# Patient Record
Sex: Male | Born: 1945
Health system: Southern US, Community
[De-identification: ages and names within clinical notes are randomized; demographics above are authoritative.]

## PROBLEM LIST (undated history)

## (undated) DIAGNOSIS — T7840XA Allergy, unspecified, initial encounter: Secondary | ICD-10-CM

## (undated) DIAGNOSIS — R001 Bradycardia, unspecified: Secondary | ICD-10-CM

## (undated) DIAGNOSIS — K4090 Unilateral inguinal hernia, without obstruction or gangrene, not specified as recurrent: Secondary | ICD-10-CM

## (undated) DIAGNOSIS — I7121 Aneurysm of the ascending aorta, without rupture: Secondary | ICD-10-CM

## (undated) DIAGNOSIS — I2510 Atherosclerotic heart disease of native coronary artery without angina pectoris: Secondary | ICD-10-CM

## (undated) HISTORY — PX: CLOSED REDUCTION ELBOW DISLOCATION: SUR215

## (undated) HISTORY — DX: Allergy, unspecified, initial encounter: T78.40XA

## (undated) HISTORY — DX: Aneurysm of the ascending aorta, without rupture: I71.21

## (undated) HISTORY — DX: Atherosclerotic heart disease of native coronary artery without angina pectoris: I25.10

## (undated) HISTORY — PX: ADENOIDECTOMY: SUR15

---
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
## 2000-07-24 HISTORY — PX: KNEE ARTHROSCOPY: SHX127

## 2008-04-08 ENCOUNTER — Encounter: Payer: Self-pay | Admitting: Family Medicine

## 2009-04-12 DIAGNOSIS — E785 Hyperlipidemia, unspecified: Secondary | ICD-10-CM | POA: Insufficient documentation

## 2009-11-20 ENCOUNTER — Emergency Department (HOSPITAL_COMMUNITY)
Admission: EM | Admit: 2009-11-20 | Discharge: 2009-11-20 | Payer: Self-pay | Source: Home / Self Care | Admitting: Emergency Medicine

## 2010-10-11 LAB — DIFFERENTIAL
Basophils Absolute: 0 10*3/uL (ref 0.0–0.1)
Eosinophils Absolute: 0 10*3/uL (ref 0.0–0.7)
Eosinophils Relative: 0 % (ref 0–5)
Lymphocytes Relative: 16 % (ref 12–46)
Monocytes Absolute: 0.5 10*3/uL (ref 0.1–1.0)
Neutro Abs: 3.9 10*3/uL (ref 1.7–7.7)

## 2010-10-11 LAB — TYPE AND SCREEN

## 2010-10-11 LAB — BASIC METABOLIC PANEL
BUN: 17 mg/dL (ref 6–23)
CO2: 27 mEq/L (ref 19–32)
Creatinine, Ser: 1.13 mg/dL (ref 0.4–1.5)
Sodium: 141 mEq/L (ref 135–145)

## 2010-10-11 LAB — CBC
HCT: 42.7 % (ref 39.0–52.0)
Hemoglobin: 14.4 g/dL (ref 13.0–17.0)
MCV: 95.7 fL (ref 78.0–100.0)
RBC: 4.46 MIL/uL (ref 4.22–5.81)

## 2010-10-18 ENCOUNTER — Encounter: Payer: Self-pay | Admitting: Family Medicine

## 2010-10-18 ENCOUNTER — Ambulatory Visit (INDEPENDENT_AMBULATORY_CARE_PROVIDER_SITE_OTHER): Payer: Medicare Other | Admitting: Family Medicine

## 2010-10-18 VITALS — BP 120/80 | HR 64 | Temp 97.9°F | Resp 12 | Ht 70.0 in | Wt 173.0 lb

## 2010-10-18 DIAGNOSIS — E785 Hyperlipidemia, unspecified: Secondary | ICD-10-CM

## 2010-10-18 DIAGNOSIS — N400 Enlarged prostate without lower urinary tract symptoms: Secondary | ICD-10-CM

## 2010-10-18 DIAGNOSIS — R350 Frequency of micturition: Secondary | ICD-10-CM

## 2010-10-18 DIAGNOSIS — R5383 Other fatigue: Secondary | ICD-10-CM

## 2010-10-18 DIAGNOSIS — Z0000 Encounter for general adult medical examination without abnormal findings: Secondary | ICD-10-CM

## 2010-10-18 LAB — CBC WITH DIFFERENTIAL/PLATELET
Eosinophils Absolute: 0 10*3/uL (ref 0.0–0.7)
Eosinophils Relative: 1.1 % (ref 0.0–5.0)
HCT: 43.5 % (ref 39.0–52.0)
Lymphocytes Relative: 40.1 % (ref 12.0–46.0)
Lymphs Abs: 1.5 10*3/uL (ref 0.7–4.0)
MCHC: 34.9 g/dL (ref 30.0–36.0)
Neutrophils Relative %: 46.3 % (ref 43.0–77.0)
RDW: 13.2 % (ref 11.5–14.6)
WBC: 3.6 10*3/uL — ABNORMAL LOW (ref 4.5–10.5)

## 2010-10-18 LAB — BASIC METABOLIC PANEL
BUN: 20 mg/dL (ref 6–23)
Chloride: 107 mEq/L (ref 96–112)
Creatinine, Ser: 0.9 mg/dL (ref 0.4–1.5)
GFR: 90.6 mL/min (ref >60.00)
Potassium: 5 mEq/L (ref 3.5–5.1)

## 2010-10-18 LAB — LIPID PANEL: HDL: 59.3 mg/dL (ref >39.00)

## 2010-10-18 LAB — LDL CHOLESTEROL, DIRECT: Direct LDL: 184.2 mg/dL

## 2010-10-18 LAB — PSA: PSA: 1.23 ng/mL (ref 0.10–4.00)

## 2010-10-18 LAB — TSH: TSH: 1.39 u[IU]/mL (ref 0.35–5.50)

## 2010-10-18 LAB — TESTOSTERONE: Testosterone: 570.87 ng/dL (ref 350.00–890.00)

## 2010-10-18 NOTE — Progress Notes (Signed)
Subjective:    Patient ID: Hunter Collins, male    DOB: [birth year], 65 y.o.   MRN: [id number]  HPI  Patient to establish care. Patient is here for wellness visit. Past medical history reveals history of dyslipidemia. He has some nocturia and urine frequency at night.   Decrease in stamina and questions of concern for possible testosterone deficiency .  Still runs regularly but much less stamina.  Duration of symptoms approximately 2 years. He takes no medications. No known drug allergies.   History of dyslipidemia with low HDL and high cholesterol.  This has not been checked in couple of years.  Only prior surgery is tonsillectomy. Family history revealing for mother dying of cancer at age 19. Unknown type. Patient nonsmoker. Patient with occasional alcohol use. Runs for exercise. Tetanus 2009. Pneumovax 2011. No prior colon cancer screening.  1.  Risk factors based on Past Medical , Social, and Family history  Biological father hx unknown.  Mother cancer as above.  No signif chronic medical problems.  No history of smoking.  Takes no meds. 2.  Limitations in physical activities  None.  Runs daily and exercises with cross training several times per week. 3.  Depression/mood No depression or anxiety issues. 4.  Hearing  No deficits. 5.  ADLs  Fully independent in all. 6.  Cognitive function (orientation to time and place, language, writing, speech,memory)  No short or long term memory deficits.  No speech or language deficits.  Judgement is intact. 7.  Home Safety  No issues identified.   8.  Height, weight, and visual acuity.  Height and weight have been stable for quite some time. 9.  Counseling  Immunizations, diet, exercise, and appropriate screening tests for age addressed. 10. Recommendation of preventive services.  Zostavax discussed but not covered under his current insurance.  Needs screening colonoscopy 11. Labs based on risk factors  Lipid, PSA, CBC, BMP, testosterone level. 12. Care  Plan  As above.  Pt encouraged to continue with regular exercise.    Review of Systems  Constitutional: Positive for fatigue. Negative for fever, chills, activity change, appetite change and unexpected weight change.  HENT: Negative for ear pain, congestion, sore throat, trouble swallowing and neck pain.   Eyes: Negative for pain and visual disturbance.  Respiratory: Negative for cough, chest tightness, shortness of breath and wheezing.   Cardiovascular: Negative for chest pain, palpitations and leg swelling.  Gastrointestinal: Negative for nausea, vomiting, abdominal pain, diarrhea, constipation, blood in stool, abdominal distention and rectal pain.  Genitourinary: Positive for frequency. Negative for dysuria, hematuria and testicular pain.  Musculoskeletal: Negative for joint swelling and arthralgias.  Skin: Negative for rash.  Neurological: Negative for dizziness, syncope and headaches.  Hematological: Negative for adenopathy.  Psychiatric/Behavioral: Negative for confusion and dysphoric mood.       Objective:   Physical Exam  Constitutional: He is oriented to person, place, and time. He appears well-developed and well-nourished. No distress.  HENT:  Head: Normocephalic and atraumatic.  Right Ear: External ear normal.  Left Ear: External ear normal.  Mouth/Throat: Oropharynx is clear and moist.  Eyes: Conjunctivae and EOM are normal. Pupils are equal, round, and reactive to light.  Neck: Normal range of motion. Neck supple. No thyromegaly present.  Cardiovascular: Normal rate, regular rhythm and normal heart sounds.   No murmur heard. Pulmonary/Chest: No respiratory distress. He has no wheezes. He has no rales.  Abdominal: Soft. Bowel sounds are normal. He exhibits no distension and no mass.  There is no tenderness. There is no rebound and no guarding.  Genitourinary: Rectum normal and prostate normal.  Musculoskeletal: He exhibits no edema.  Lymphadenopathy:    He has no  cervical adenopathy.  Neurological: He is alert and oriented to person, place, and time. He displays normal reflexes. No cranial nerve deficit.  Skin: No rash noted.  Psychiatric: He has a normal mood and affect.          Assessment & Plan:   #1 wellness issues discussed. Needs colon cancer screening. Discussed shingles vaccine but no coverage. Tetanus and Pneumovax up-to-date. Continue yearly flu vaccine.  #2 decreased stamina and fatigue. Check labs with CBC, TSH and testosterone level.  #3 reported dyslipidemia with history of low HDL. Reassess lipids

## 2010-10-19 ENCOUNTER — Telehealth: Payer: Self-pay | Admitting: *Deleted

## 2010-10-19 NOTE — Telephone Encounter (Signed)
Pt informed and he will work on his diet

## 2011-07-04 ENCOUNTER — Ambulatory Visit (INDEPENDENT_AMBULATORY_CARE_PROVIDER_SITE_OTHER): Payer: Medicare Other | Admitting: Family Medicine

## 2011-07-04 ENCOUNTER — Encounter: Payer: Self-pay | Admitting: Family Medicine

## 2011-07-04 VITALS — BP 120/70 | Temp 97.8°F | Wt 170.0 lb

## 2011-07-04 DIAGNOSIS — B9789 Other viral agents as the cause of diseases classified elsewhere: Secondary | ICD-10-CM

## 2011-07-04 DIAGNOSIS — Z23 Encounter for immunization: Secondary | ICD-10-CM

## 2011-07-04 DIAGNOSIS — B349 Viral infection, unspecified: Secondary | ICD-10-CM

## 2011-07-04 MED ORDER — AZITHROMYCIN 250 MG PO TABS: ORAL_TABLET | ORAL | Status: AC

## 2011-07-04 NOTE — Progress Notes (Signed)
  Subjective:    Patient ID: Hunter Collins, male    DOB: 1945/08/13, 65 y.o.   MRN: [id number]  HPI  Acute visit. Onset last Thursday of illness. Bodyaches, sore throat, nasal congestion and subsequent cough. He actually ran a half marathon on Saturday. Feels somewhat better today. Still has cough which is occasionally productive. Generally very healthy. No history of smoking. No flu vaccine yet. Sore throat symptoms have improved. Using NyQuil for nighttime cough relief  Review of Systems  Constitutional: Negative for fever, chills and activity change.  HENT: Positive for congestion and sore throat.   Respiratory: Positive for cough. Negative for wheezing.   Cardiovascular: Negative for chest pain.       Objective:   Physical Exam  Constitutional: He appears well-developed and well-nourished.  HENT:  Right Ear: External ear normal.  Left Ear: External ear normal.  Mouth/Throat: Oropharynx is clear and moist.  Neck: Neck supple.  Cardiovascular: Normal rate and regular rhythm.   Pulmonary/Chest: Effort normal and breath sounds normal. No respiratory distress. He has no wheezes. He has no rales.  Musculoskeletal: He exhibits no edema.  Lymphadenopathy:    He has no cervical adenopathy.          Assessment & Plan:  Probable viral syndrome. No antibiotics indicated this time. If he has any recurrent fever start Zithromax. Flu vaccine given

## 2011-07-04 NOTE — Patient Instructions (Signed)
Follow up promptly for any fever or worsening symptoms 

## 2011-09-20 ENCOUNTER — Telehealth: Payer: Self-pay | Admitting: *Deleted

## 2011-09-20 DIAGNOSIS — M25569 Pain in unspecified knee: Secondary | ICD-10-CM

## 2011-09-20 NOTE — Telephone Encounter (Signed)
Received a referral request via letter from pt requesting to see Dr Lavada Mesi (friend a associate in a bible class) for right knee pain

## 2011-09-21 NOTE — Telephone Encounter (Signed)
Will refer.  Let pt know

## 2011-09-21 NOTE — Telephone Encounter (Signed)
Pt informed on home VM, will scan letter to chart

## 2012-05-08 ENCOUNTER — Ambulatory Visit (INDEPENDENT_AMBULATORY_CARE_PROVIDER_SITE_OTHER): Payer: Medicare Other

## 2012-05-08 DIAGNOSIS — Z23 Encounter for immunization: Secondary | ICD-10-CM

## 2013-01-08 ENCOUNTER — Ambulatory Visit (INDEPENDENT_AMBULATORY_CARE_PROVIDER_SITE_OTHER): Payer: Medicare Other | Admitting: Family Medicine

## 2013-01-08 ENCOUNTER — Encounter: Payer: Self-pay | Admitting: Family Medicine

## 2013-01-08 ENCOUNTER — Telehealth: Payer: Self-pay | Admitting: Family Medicine

## 2013-01-08 VITALS — BP 110/72 | HR 97 | Temp 98.2°F | Wt 162.0 lb

## 2013-01-08 DIAGNOSIS — J019 Acute sinusitis, unspecified: Secondary | ICD-10-CM

## 2013-01-08 MED ORDER — AZITHROMYCIN 250 MG PO TABS: ORAL_TABLET | ORAL | Status: DC

## 2013-01-08 NOTE — Progress Notes (Signed)
  Subjective:    Patient ID: Hunter Collins, male    DOB: 27-Sep-1945, 67 y.o.   MRN: [id number]  HPI Here for 2 weeks of stuffy head, PND, chest tightness and coughing up green sputum. No fever. He tried Zyrtec D.    Review of Systems  Constitutional: Negative.   HENT: Positive for congestion, postnasal drip and sinus pressure.   Eyes: Negative.   Respiratory: Positive for cough and chest tightness.        Objective:   Physical Exam  Constitutional: He appears well-developed and well-nourished. No distress.  HENT:  Right Ear: External ear normal.  Left Ear: External ear normal.  Mouth/Throat: Oropharynx is clear and moist.  Eyes: Conjunctivae are normal.  Neck: No thyromegaly present.  Pulmonary/Chest: Effort normal and breath sounds normal. No respiratory distress. He has no wheezes. He has no rales.  Lymphadenopathy:    He has no cervical adenopathy.          Assessment & Plan:  Add Mucinex prn

## 2013-01-08 NOTE — Telephone Encounter (Signed)
Patient Information:  Caller Name: Jervis  Phone: [phone]  Patient: Hunter Collins, Hunter Collins  Gender: Male  DOB: 11/03/1945  Age: 67 Years  PCP: [facility])  Office Follow Up:  Does the office need to follow up with this patient?: N/A  Instructions For The Office: N/A   Symptoms  Reason For Call & Symptoms: Cold sx for 11 days. Now getting chest congestion. Requesting appt.  Reviewed Health History In EMR: Yes  Reviewed Medications In EMR: Yes  Reviewed Allergies In EMR: Yes  Reviewed Surgeries / Procedures: Yes  Date of Onset of Symptoms: 12/29/2012  Treatments Tried: Tylenol, Zyrtec  Treatments Tried Worked: No  Guideline(s) Used:  Colds  Disposition Per Guideline:   See Today or Tomorrow in Office  Reason For Disposition Reached:   Patient wants to be seen  Advice Given:  N/A  Patient Will Follow Care Advice:  YES  Appointment Scheduled:  01/08/2013 11:15:00 Appointment Scheduled Provider:  Gershon Crane (Family Practice)

## 2013-01-15 ENCOUNTER — Encounter: Payer: Self-pay | Admitting: Family Medicine

## 2013-01-15 ENCOUNTER — Ambulatory Visit (INDEPENDENT_AMBULATORY_CARE_PROVIDER_SITE_OTHER): Payer: Medicare Other | Admitting: Family Medicine

## 2013-01-15 VITALS — BP 110/68 | HR 54 | Temp 98.2°F | Resp 14 | Wt 165.2 lb

## 2013-01-15 DIAGNOSIS — R1031 Right lower quadrant pain: Secondary | ICD-10-CM

## 2013-01-15 NOTE — Progress Notes (Signed)
  Subjective:    Patient ID: Hunter Collins, male    DOB: [birth year], 67 y.o.   MRN: [id number]  HPI  Patient seen for mild right inguinal discomfort noted over the past couple of months Worse after recent cough. No injury. No history of hernia. He has not noted any definite bulge. No abdominal pain Still running regularly without difficulty He's not noted adenopathy. No skin rashes. No appetite or weight changes.  No past medical history on file. Past Surgical History  Procedure Laterality Date  . Tonsillectomy and adenoidectomy  1960    reports that he has never smoked. He has never used smokeless tobacco. He reports that  drinks alcohol. He reports that he does not use illicit drugs. family history is not on file. No Known Allergies   Review of Systems  Constitutional: Negative for appetite change and unexpected weight change.  Gastrointestinal: Negative for abdominal pain.  Genitourinary: Negative for dysuria.       Objective:   Physical Exam  Constitutional: He appears well-developed and well-nourished.  Cardiovascular: Normal rate and regular rhythm.   Pulmonary/Chest: Effort normal and breath sounds normal. No respiratory distress. He has no wheezes. He has no rales.  Genitourinary:  Testes are normal. No definite inguinal hernia. No significant inguinal adenopathy.          Assessment & Plan:  Right inguinal pain. No definite hernia. Question oblique muscle strain. Avoid abdominal crunches and vigorous abdominal exercises. Followup promptly for any bulge or swelling or recurrent pain

## 2013-06-11 ENCOUNTER — Ambulatory Visit (INDEPENDENT_AMBULATORY_CARE_PROVIDER_SITE_OTHER): Payer: Medicare Other

## 2013-06-11 DIAGNOSIS — Z23 Encounter for immunization: Secondary | ICD-10-CM

## 2013-06-16 ENCOUNTER — Telehealth: Payer: Self-pay | Admitting: Family Medicine

## 2013-06-16 NOTE — Telephone Encounter (Signed)
Informed patient that [facility]. And he does not need a referral to go to that location

## 2013-06-16 NOTE — Telephone Encounter (Signed)
Pt would like a referral to a podiatrist for a severe corn on his right foot.

## 2013-06-16 NOTE — Telephone Encounter (Signed)
Left message for patient to return call.

## 2014-04-02 ENCOUNTER — Other Ambulatory Visit: Payer: Self-pay | Admitting: Family Medicine

## 2014-04-02 ENCOUNTER — Telehealth: Payer: Self-pay | Admitting: Family Medicine

## 2014-04-02 DIAGNOSIS — L600 Ingrowing nail: Secondary | ICD-10-CM

## 2014-04-02 DIAGNOSIS — B070 Plantar wart: Secondary | ICD-10-CM

## 2014-04-02 NOTE — Telephone Encounter (Signed)
Referral is ordered

## 2014-04-02 NOTE — Telephone Encounter (Signed)
OK to refer.

## 2014-04-02 NOTE — Telephone Encounter (Signed)
Pt states he changed insurance and needs a new referral to [facility]  [phone]  R foot plantars wart and ingrown toenail on L foot, big toe. [employer]  Pt will bring copy of new insurance card

## 2014-04-07 ENCOUNTER — Telehealth: Payer: Self-pay | Admitting: Family Medicine

## 2014-04-07 NOTE — Telephone Encounter (Signed)
Called to scheduled pt with  [place], [place]  Address: [address] [place], [place] [address]  [phone] Their office will contact pt to schedule directly to schedule pt came by to check the status of referral I called [facility]  Was told that they can  Not scheduled patient until their office receive a paper copy fax  When i went to [facility]  A msg from  [facility] stated Per your request, a copy of this referral has been faxed to [phone]. Per judy tyra [facility]  INFORMED PT OF THIS HE IS AWARE

## 2014-04-30 ENCOUNTER — Ambulatory Visit (INDEPENDENT_AMBULATORY_CARE_PROVIDER_SITE_OTHER): Payer: Commercial Managed Care - HMO | Admitting: Family Medicine

## 2014-04-30 ENCOUNTER — Encounter: Payer: Self-pay | Admitting: Family Medicine

## 2014-04-30 VITALS — BP 120/70 | HR 50 | Temp 97.6°F | Ht 69.0 in | Wt 169.0 lb

## 2014-04-30 DIAGNOSIS — Z23 Encounter for immunization: Secondary | ICD-10-CM

## 2014-04-30 DIAGNOSIS — L989 Disorder of the skin and subcutaneous tissue, unspecified: Secondary | ICD-10-CM

## 2014-04-30 DIAGNOSIS — Z0000 Encounter for general adult medical examination without abnormal findings: Secondary | ICD-10-CM

## 2014-04-30 NOTE — Patient Instructions (Signed)
Let me know if you change your mind regarding colonoscopy screening or PSA

## 2014-04-30 NOTE — Progress Notes (Signed)
Subjective:    Patient ID: Hunter Collins, male    DOB: [birth year], 68 y.o.   MRN: [id number]  HPI Patient seen for wellness visit. Generally very healthy. He continues to run couple days per week and walks regularly. Takes no regular rx medications. He is requesting shingles vaccine and also needs Prevnar booster. He declines colonoscopy screening.  Skin lesion right upper lip which he noticed couple years ago. Slowly growing in size. Occasional irritation with shaving. No itching or bleeding.  No past medical history on file. Past Surgical History  Procedure Laterality Date  . Tonsillectomy and adenoidectomy  1960    reports that he has never smoked. He has never used smokeless tobacco. He reports that he drinks alcohol. He reports that he does not use illicit drugs. family history is not on file. No Known Allergies  1.  Risk factors based on Past Medical , Social, and Family history reviewed and as indicated above with no changes 2.  Limitations in physical activities None.  No recent falls. Very active with walking and running. 3.  Depression/mood No active depression or anxiety issues 4.  Hearing No defiits 5.  ADLs independent in all. 6.  Cognitive function (orientation to time and place, language, writing, speech,memory) no short or long term memory issues.  Language and judgement intact. 7.  Home Safety no issues 8.  Height, weight, and visual acuity.all stable. 9.  Counseling discussed continued weight bearing exercise. 10. Recommendation of preventive services. Flu vaccine, Prevnar 13 and shingles vaccine. 11. Labs based on risk factors pt declines 12. Care Plan as above. 13. Other Providers-none 14. Written schedule of screening/prevention services given to patient.    Review of Systems  Constitutional: Negative for fever, activity change, appetite change and fatigue.  HENT: Negative for congestion, ear pain and trouble swallowing.   Eyes: Negative for pain and  visual disturbance.  Respiratory: Negative for cough, shortness of breath and wheezing.   Cardiovascular: Negative for chest pain and palpitations.  Gastrointestinal: Negative for nausea, vomiting, abdominal pain, diarrhea, constipation, blood in stool, abdominal distention and rectal pain.  Genitourinary: Negative for dysuria, hematuria and testicular pain.  Musculoskeletal: Negative for arthralgias and joint swelling.  Skin: Negative for rash.  Neurological: Negative for dizziness, syncope and headaches.  Hematological: Negative for adenopathy.  Psychiatric/Behavioral: Negative for confusion and dysphoric mood.       Objective:   Physical Exam  Constitutional: He is oriented to person, place, and time. He appears well-developed and well-nourished. No distress.  HENT:  Head: Normocephalic and atraumatic.  Right Ear: External ear normal.  Left Ear: External ear normal.  Mouth/Throat: Oropharynx is clear and moist.  Eyes: Conjunctivae and EOM are normal. Pupils are equal, round, and reactive to light.  Neck: Normal range of motion. Neck supple. No thyromegaly present.  Cardiovascular: Normal rate, regular rhythm and normal heart sounds.   No murmur heard. Pulmonary/Chest: No respiratory distress. He has no wheezes. He has no rales.  Abdominal: Soft. Bowel sounds are normal. He exhibits no distension and no mass. There is no tenderness. There is no rebound and no guarding.  Musculoskeletal: He exhibits no edema.  Lymphadenopathy:    He has no cervical adenopathy.  Neurological: He is alert and oriented to person, place, and time. He displays normal reflexes. No cranial nerve deficit.  Skin: No rash noted.  Just above the right upper lip he has nodular lesion about 4 mm diameter with slight umbilication in the center and  superficial telangiectasias  Psychiatric: He has a normal mood and affect.          Assessment & Plan:  #1 health maintenance. Prevnar 13 given and shingles  vaccine after discussed risk and benefits. He declines colonoscopy screening. We discussed other screening such as PSA and he declines. #2 right face lesion. Suspect basal cell carcinoma. [facility]

## 2014-04-30 NOTE — Progress Notes (Signed)
Pre visit review using our clinic review tool, if applicable. No additional management support is needed unless otherwise documented below in the visit note. 

## 2014-07-30 DIAGNOSIS — L570 Actinic keratosis: Secondary | ICD-10-CM | POA: Diagnosis not present

## 2014-07-30 DIAGNOSIS — L821 Other seborrheic keratosis: Secondary | ICD-10-CM | POA: Diagnosis not present

## 2014-07-30 DIAGNOSIS — C44319 Basal cell carcinoma of skin of other parts of face: Secondary | ICD-10-CM | POA: Diagnosis not present

## 2014-07-30 DIAGNOSIS — D485 Neoplasm of uncertain behavior of skin: Secondary | ICD-10-CM | POA: Diagnosis not present

## 2014-08-28 DIAGNOSIS — R69 Illness, unspecified: Secondary | ICD-10-CM | POA: Diagnosis not present

## 2014-09-11 DIAGNOSIS — C44319 Basal cell carcinoma of skin of other parts of face: Secondary | ICD-10-CM | POA: Diagnosis not present

## 2014-09-18 ENCOUNTER — Telehealth: Payer: Self-pay | Admitting: Family Medicine

## 2014-09-18 NOTE — Telephone Encounter (Signed)
Noted  

## 2014-09-18 NOTE — Telephone Encounter (Signed)
Patient Name: Hunter Collins  DOB: May 04, 1946    Initial Comment Caller states c/o imbedded tick bite on his hip, removed it but hip is red   Nurse Assessment  Nurse: Verlin Fester RN, Stanton Kidney Date/Time (Eastern Time): 09/18/2014 11:52:20 AM  Confirm and document reason for call. If symptomatic, describe symptoms. ---Patient states he had a tick bite on his right hip and it is red.  Has the patient traveled out of the country within the last 30 days? ---No  Does the patient require triage? ---Yes  Related visit to physician within the last 2 weeks? ---No  Does the PT have any chronic conditions? (i.e. diabetes, asthma, etc.) ---No     Guidelines    Guideline Title Affirmed Question Affirmed Notes  Tick Bite Tick bite with no complications (all triage questions negative)    Final Disposition User   [place], RN, [place]

## 2014-11-06 ENCOUNTER — Encounter: Payer: Self-pay | Admitting: Family Medicine

## 2014-11-06 ENCOUNTER — Ambulatory Visit (INDEPENDENT_AMBULATORY_CARE_PROVIDER_SITE_OTHER): Payer: Commercial Managed Care - HMO | Admitting: Family Medicine

## 2014-11-06 VITALS — BP 100/72 | HR 61 | Temp 98.2°F | Ht 69.0 in | Wt 170.4 lb

## 2014-11-06 DIAGNOSIS — J069 Acute upper respiratory infection, unspecified: Secondary | ICD-10-CM | POA: Diagnosis not present

## 2014-11-06 MED ORDER — AZITHROMYCIN 250 MG PO TABS: ORAL_TABLET | ORAL | Status: DC

## 2014-11-06 NOTE — Patient Instructions (Addendum)
INSTRUCTIONS FOR UPPER RESPIRATORY INFECTION:  -plenty of rest and fluids  -nasal saline wash 2-3 times daily (use prepackaged nasal saline or bottled/distilled water if making your own)   -can use sinex or afrin nasal spray for drainage and nasal congestion - but do NOT use longer then 3-4 days  -can use tylenol or ibuprofen as directed for aches and sorethroat  -in the winter time, using a humidifier at night is helpful (please follow cleaning instructions)  -if you are taking a cough medication - use only as directed, may also try a teaspoon of honey to coat the throat and throat lozenges  -for sore throat, salt water gargles can help  -follow up if you have fevers, facial pain, tooth pain, difficulty breathing or are worsening or not getting better as expected

## 2014-11-06 NOTE — Progress Notes (Signed)
HPI:  Hunter Collins is a 69 yo patient of Dr. Elease Hashimoto here for and acute visit for Resp Infection: -started: 4 days ago -symptoms:nasal congestion, sore throat, cough, body aches, subjective fever, mild diarrhea a few times -denies: fever, SOB, NVD, sinus pain, tooth pain -has tried: musinex, claritin -sick contacts/travel/risks: denies flu exposure, tick exposure or or Ebola risks - a few colleagues at work with the "crud" -reports had zpack for this in the past -still walking 5-6 miles per day even while sick  ROS: See pertinent positives and negatives per HPI.  No past medical history on file.  Past Surgical History  Procedure Laterality Date  . Tonsillectomy and adenoidectomy  1960    No family history on file.  History   Social History  . Marital Status: Married    Spouse Name: N/A  . Number of Children: N/A  . Years of Education: N/A   Social History Main Topics  . Smoking status: Never Smoker   . Smokeless tobacco: Never Used  . Alcohol Use: Yes     Comment: weekends  . Drug Use: No  . Sexual Activity: Not on file   Other Topics Concern  . None   Social History Narrative     Current outpatient prescriptions:  .  Cholecalciferol (VITAMIN D3) 5000 UNITS CAPS, Take by mouth., Disp: , Rfl:  .  ibuprofen (ADVIL,MOTRIN) 100 MG chewable tablet, Chew 100 mg by mouth every 8 (eight) hours as needed.  , Disp: , Rfl:  .  Misc Natural Products (GLUCOSAMINE CHONDROITIN ADV) TABS, Take by mouth daily as needed.  , Disp: , Rfl:  .  Multiple Vitamins-Minerals (DAILY-VITE MENS FORMULA PO), Take by mouth daily.  , Disp: , Rfl:  .  azithromycin (ZITHROMAX) 250 MG tablet, 2 tabs on the first day and then 1 tab daily for 4 days, Disp: 6 tablet, Rfl: 0  EXAM:  Filed Vitals:   11/06/14 1329  BP: 100/72  Pulse: 61  Temp: 98.2 F (36.8 C)    Body mass index is 25.15 kg/(m^2).  GENERAL: vitals reviewed and listed above, alert, oriented, appears well hydrated and in  no acute distress  HEENT: atraumatic, conjunttiva clear, no obvious abnormalities on inspection of external nose and ears, normal appearance of ear canals and TMs, clear nasal congestion, mild post oropharyngeal erythema with PND, no tonsillar edema or exudate, no sinus TTP  NECK: no obvious masses on inspection  LUNGS: clear to auscultation bilaterally, no wheezes, rales or rhonchi, good air movement  CV: HRRR, no peripheral edema  MS: moves all extremities without noticeable abnormality  PSYCH: pleasant and cooperative, no obvious depression or anxiety  ASSESSMENT AND PLAN:  Discussed the following assessment and plan:  Acute upper respiratory infection - Plan: azithromycin (ZITHROMAX) 250 MG tablet  -given HPI and exam findings today, a serious infection or illness is unlikely. We discussed potential etiologies, with VURI being most likely, and advised supportive care and monitoring. We discussed treatment side effects, likely course, antibiotic misuse, transmission, and signs of developing a serious illness. -he is worried for cap given hx of this, lung exam normal today, delayed abx provided if worsening, fevers, SOB or if not getting better as expected - warned of adverse reactions -of course, we advised to return or notify a doctor immediately if symptoms worsen or persist or new concerns arise.    Patient Instructions  INSTRUCTIONS FOR UPPER RESPIRATORY INFECTION:  -plenty of rest and fluids  -nasal saline wash 2-3  times daily (use prepackaged nasal saline or bottled/distilled water if making your own)   -clean nose with nasal saline before using the nasal steroid or sinex  -can use sinex nasal spray for drainage and nasal congestion - but do NOT use longer then 3-4 days  -can use tylenol or ibuprofen as directed for aches and sorethroat  -in the winter time, using a humidifier at night is helpful (please follow cleaning instructions)  -if you are taking a cough  medication - use only as directed, may also try a teaspoon of honey to coat the throat and throat lozenges  -for sore throat, salt water gargles can help  -follow up if you have fevers, facial pain, tooth pain, difficulty breathing or are worsening or not getting better as expected       Gilberto Streck, Jarrett Soho R.

## 2014-11-06 NOTE — Progress Notes (Signed)
Pre visit review using our clinic review tool, if applicable. No additional management support is needed unless otherwise documented below in the visit note. 

## 2014-12-23 ENCOUNTER — Telehealth: Payer: Self-pay | Admitting: Family Medicine

## 2014-12-23 NOTE — Telephone Encounter (Signed)
Pt called to ask for a referral to [facility]  Reason ;  Corn on right little toe and a callus on bottom of right foot

## 2014-12-23 NOTE — Telephone Encounter (Signed)
OK to refer.

## 2014-12-24 ENCOUNTER — Other Ambulatory Visit: Payer: Self-pay | Admitting: Family Medicine

## 2014-12-24 ENCOUNTER — Encounter: Payer: Self-pay | Admitting: Family Medicine

## 2014-12-24 DIAGNOSIS — L84 Corns and callosities: Secondary | ICD-10-CM

## 2014-12-24 NOTE — Telephone Encounter (Signed)
Referral is ordered

## 2015-01-28 DIAGNOSIS — D485 Neoplasm of uncertain behavior of skin: Secondary | ICD-10-CM | POA: Diagnosis not present

## 2015-01-28 DIAGNOSIS — D1801 Hemangioma of skin and subcutaneous tissue: Secondary | ICD-10-CM | POA: Diagnosis not present

## 2015-01-28 DIAGNOSIS — L821 Other seborrheic keratosis: Secondary | ICD-10-CM | POA: Diagnosis not present

## 2015-01-28 DIAGNOSIS — Z85828 Personal history of other malignant neoplasm of skin: Secondary | ICD-10-CM | POA: Diagnosis not present

## 2015-01-28 DIAGNOSIS — L905 Scar conditions and fibrosis of skin: Secondary | ICD-10-CM | POA: Diagnosis not present

## 2015-01-28 DIAGNOSIS — L570 Actinic keratosis: Secondary | ICD-10-CM | POA: Diagnosis not present

## 2015-01-28 DIAGNOSIS — D225 Melanocytic nevi of trunk: Secondary | ICD-10-CM | POA: Diagnosis not present

## 2015-01-29 ENCOUNTER — Ambulatory Visit (INDEPENDENT_AMBULATORY_CARE_PROVIDER_SITE_OTHER): Payer: Commercial Managed Care - HMO | Admitting: Podiatry

## 2015-01-29 DIAGNOSIS — M2041 Other hammer toe(s) (acquired), right foot: Secondary | ICD-10-CM

## 2015-01-29 DIAGNOSIS — Q828 Other specified congenital malformations of skin: Secondary | ICD-10-CM

## 2015-01-29 NOTE — Progress Notes (Signed)
Subjective:     Patient ID: Hunter Collins, male   DOB: 1946/05/10, 69 y.o.   MRN: [id number]  HPI This patient presents with painful callus under right foot and corn fifth toe right foot.  Patient is an avid runner who was born with clubfoot which has led to severe rearfoot changes in the absence of pain.  He has callus under ball of right foot.  He has corn on fifth toe right foot due to fifth toe rising up.  He presents for evaluation and treatment.  Review of Systems     Objective:   Physical Exam Objective: Review of past medical history, medications, social history and allergies were performed.  Vascular: Dorsalis pedis and posterior tibial pulses were palpable B/L, capillary refill was  WNL B/L, temperature gradient was WNL B/L   Skin:  No signs of symptoms of infection or ulcers on both feet.  Porokeratosis 2,3 right foot.  Nails: appear healthy with no signs of mycosis or infections  Sensory: Semmes Weinstein monifilament WNL   Orthopedic: Orthopedic evaluation demonstrates all joints distal t ankle have full ROM without crepitus, muscle power WNL B/L.  Plantarflexed 2,3 right foot.  Hammer toes 2-4 and 5 is contracted with helma durum              Assessment:    Porokeratosis right foot    Hammer toe right foot    Plan:     IE.  Debridement of porokeratosis.  Debrided heloma molle.  RTC prn

## 2015-01-29 NOTE — Progress Notes (Signed)
Patient ID: Hunter Collins, male   DOB: 13-Jan-1946, 69 y.o.   MRN: [id number]

## 2015-06-30 ENCOUNTER — Ambulatory Visit (INDEPENDENT_AMBULATORY_CARE_PROVIDER_SITE_OTHER): Payer: Commercial Managed Care - HMO | Admitting: Family Medicine

## 2015-06-30 DIAGNOSIS — Z23 Encounter for immunization: Secondary | ICD-10-CM | POA: Diagnosis not present

## 2015-07-06 ENCOUNTER — Ambulatory Visit (INDEPENDENT_AMBULATORY_CARE_PROVIDER_SITE_OTHER): Payer: Commercial Managed Care - HMO | Admitting: Family Medicine

## 2015-07-06 ENCOUNTER — Encounter: Payer: Self-pay | Admitting: Family Medicine

## 2015-07-06 ENCOUNTER — Other Ambulatory Visit: Payer: Commercial Managed Care - HMO

## 2015-07-06 VITALS — BP 118/82 | HR 51 | Temp 97.7°F | Resp 14 | Ht 69.0 in | Wt 168.9 lb

## 2015-07-06 DIAGNOSIS — Z0000 Encounter for general adult medical examination without abnormal findings: Secondary | ICD-10-CM

## 2015-07-06 DIAGNOSIS — L84 Corns and callosities: Secondary | ICD-10-CM | POA: Diagnosis not present

## 2015-07-06 DIAGNOSIS — K4090 Unilateral inguinal hernia, without obstruction or gangrene, not specified as recurrent: Secondary | ICD-10-CM

## 2015-07-06 LAB — LIPID PANEL
Cholesterol: 253 mg/dL — ABNORMAL HIGH (ref 0–200)
HDL: 54.1 mg/dL (ref >39.00)
LDL Cholesterol: 179 mg/dL — ABNORMAL HIGH (ref 0–99)
NonHDL: 198.51
TRIGLYCERIDES: 99 mg/dL (ref 0.0–149.0)
Total CHOL/HDL Ratio: 5
VLDL: 19.8 mg/dL (ref 0.0–40.0)

## 2015-07-06 LAB — CBC WITH DIFFERENTIAL/PLATELET
BASOS PCT: 0.7 % (ref 0.0–3.0)
Basophils Absolute: 0 10*3/uL (ref 0.0–0.1)
EOS PCT: 0.8 % (ref 0.0–5.0)
Eosinophils Absolute: 0 10*3/uL (ref 0.0–0.7)
HEMATOCRIT: 47.7 % (ref 39.0–52.0)
Hemoglobin: 15.9 g/dL (ref 13.0–17.0)
LYMPHS ABS: 2 10*3/uL (ref 0.7–4.0)
LYMPHS PCT: 41.5 % (ref 12.0–46.0)
MCHC: 33.3 g/dL (ref 30.0–36.0)
MCV: 96.9 fl (ref 78.0–100.0)
MONOS PCT: 11.3 % (ref 3.0–12.0)
Monocytes Absolute: 0.5 10*3/uL (ref 0.1–1.0)
NEUTROS ABS: 2.2 10*3/uL (ref 1.4–7.7)
NEUTROS PCT: 45.7 % (ref 43.0–77.0)
PLATELETS: 207 10*3/uL (ref 150.0–400.0)
RBC: 4.92 Mil/uL (ref 4.22–5.81)
RDW: 13.7 % (ref 11.5–15.5)
WBC: 4.8 10*3/uL (ref 4.0–10.5)

## 2015-07-06 LAB — BASIC METABOLIC PANEL
BUN: 21 mg/dL (ref 6–23)
CALCIUM: 9.7 mg/dL (ref 8.4–10.5)
CO2: 29 mEq/L (ref 19–32)
Chloride: 103 mEq/L (ref 96–112)
Creatinine, Ser: 1.12 mg/dL (ref 0.40–1.50)
GFR: 68.54 mL/min (ref >60.00)
Glucose, Bld: 95 mg/dL (ref 70–99)
Potassium: 4.5 mEq/L (ref 3.5–5.1)
SODIUM: 139 meq/L (ref 135–145)

## 2015-07-06 LAB — TSH: TSH: 1.92 u[IU]/mL (ref 0.35–4.50)

## 2015-07-06 LAB — HEPATIC FUNCTION PANEL
ALBUMIN: 4.3 g/dL (ref 3.5–5.2)
ALK PHOS: 55 U/L (ref 39–117)
ALT: 18 U/L (ref 0–53)
AST: 18 U/L (ref 0–37)
Bilirubin, Direct: 0.2 mg/dL (ref 0.0–0.3)
TOTAL PROTEIN: 6.6 g/dL (ref 6.0–8.3)
Total Bilirubin: 0.9 mg/dL (ref 0.2–1.2)

## 2015-07-06 NOTE — Patient Instructions (Signed)
Hernia, Adult A hernia is the bulging of an organ or tissue through a weak spot in the muscles of the abdomen (abdominal wall). Hernias develop most often near the navel or groin. There are many kinds of hernias. Common kinds include:  Femoral hernia. This kind of hernia develops under the groin in the upper thigh area.  Inguinal hernia. This kind of hernia develops in the groin or scrotum.  Umbilical hernia. This kind of hernia develops near the navel.  Hiatal hernia. This kind of hernia causes part of the stomach to be pushed up into the chest.  Incisional hernia. This kind of hernia bulges through a scar from an abdominal surgery. CAUSES This condition may be caused by:  Heavy lifting.  Coughing over a long period of time.  Straining to have a bowel movement.  An incision made during an abdominal surgery.  A birth defect (congenital defect).  Excess weight or obesity.  Smoking.  Poor nutrition.  Cystic fibrosis.  Excess fluid in the abdomen.  Undescended testicles. SYMPTOMS Symptoms of a hernia include:  A lump on the abdomen. This is the first sign of a hernia. The lump may become more obvious with standing, straining, or coughing. It may get bigger over time if it is not treated or if the condition causing it is not treated.  Pain. A hernia is usually painless, but it may become painful over time if treatment is delayed. The pain is usually dull and may get worse with standing or lifting heavy objects. Sometimes a hernia gets tightly squeezed in the weak spot (strangulated) or stuck there (incarcerated) and causes additional symptoms. These symptoms may include:  Vomiting.  Nausea.  Constipation.  Irritability. DIAGNOSIS A hernia may be diagnosed with:  A physical exam. During the exam your health care provider may ask you to cough or to make a specific movement, because a hernia is usually more visible when you move.  Imaging tests. These can  include:  X-rays.  Ultrasound.  CT scan. TREATMENT A hernia that is small and painless may not need to be treated. A hernia that is large or painful may be treated with surgery. Inguinal hernias may be treated with surgery to prevent incarceration or strangulation. Strangulated hernias are always treated with surgery, because lack of blood to the trapped organ or tissue can cause it to die. Surgery to treat a hernia involves pushing the bulge back into place and repairing the weak part of the abdomen. HOME CARE INSTRUCTIONS  Avoid straining.  Do not lift anything heavier than 10 lb (4.5 kg).  Lift with your leg muscles, not your back muscles. This helps avoid strain.  When coughing, try to cough gently.  Prevent constipation. Constipation leads to straining with bowel movements, which can make a hernia worse or cause a hernia repair to break down. You can prevent constipation by:  Eating a high-fiber diet that includes plenty of fruits and vegetables.  Drinking enough fluids to keep your urine clear or pale yellow. Aim to drink 6-8 glasses of water per day.  Using a stool softener as directed by your health care provider.  Lose weight, if you are overweight.  Do not use any tobacco products, including cigarettes, chewing tobacco, or electronic cigarettes. If you need help quitting, ask your health care provider.  Keep all follow-up visits as directed by your health care provider. This is important. Your health care provider may need to monitor your condition. SEEK MEDICAL CARE IF:  You have   swelling, redness, and pain in the affected area.  Your bowel habits change. SEEK IMMEDIATE MEDICAL CARE IF:  You have a fever.  You have abdominal pain that is getting worse.  You feel nauseous or you vomit.  You cannot push the hernia back in place by gently pressing on it while you are lying down.  The hernia:  Changes in shape or size.  Is stuck outside the  abdomen.  Becomes discolored.  Feels hard or tender.   This information is not intended to replace advice given to you by your health care provider. Make sure you discuss any questions you have with your health care provider.   Document Released: 07/10/2005 Document Revised: 07/31/2014 Document Reviewed: 05/20/2014 Elsevier Interactive Patient Education 2016 [employer]  

## 2015-07-06 NOTE — Progress Notes (Signed)
   Subjective:    Patient ID: Hunter Collins, male    DOB: [birth year], 69 y.o.   MRN: [id number]  HPI Here for several items  Plans to get complete physical next week. Requesting labs today. Declines PSA  Right inguinal hernia. Present for several years. Started become more painful with walking. Interestingly, does not have much pain with running Sometimes bulges with straining. Requesting surgical referral.  No prior hx of hernia.  Painful callus bottom of right foot. Has seen podiatrist previously. Had some sort of acid topical treatment and this improved. Runs several days per week.  No past medical history on file. Past Surgical History  Procedure Laterality Date  . Tonsillectomy and adenoidectomy  1960    reports that he has never smoked. He has never used smokeless tobacco. He reports that he drinks alcohol. He reports that he does not use illicit drugs. family history is not on file. No Known Allergies    Review of Systems  Constitutional: Negative for fatigue.  Eyes: Negative for visual disturbance.  Respiratory: Negative for cough, chest tightness and shortness of breath.   Cardiovascular: Negative for chest pain, palpitations and leg swelling.  Neurological: Negative for dizziness, syncope, weakness, light-headedness and headaches.       Objective:   Physical Exam  Constitutional: He appears well-developed and well-nourished.  Cardiovascular: Normal rate and regular rhythm.   Pulmonary/Chest: Effort normal and breath sounds normal. No respiratory distress. He has no wheezes. He has no rales.  Abdominal:  Small right inguinal hernia. Nontender  Skin:  Callused area ball of right foot. No evidence for plantar wart          Assessment & Plan:  #1 right inguinal hernia. Becoming more symptomatic. Set up surgical referral #2 callus right foot. We'll plan to consider trimming this at his upcoming physical.

## 2015-07-14 ENCOUNTER — Encounter: Payer: Self-pay | Admitting: Family Medicine

## 2015-07-14 ENCOUNTER — Ambulatory Visit (INDEPENDENT_AMBULATORY_CARE_PROVIDER_SITE_OTHER): Payer: Commercial Managed Care - HMO | Admitting: Family Medicine

## 2015-07-14 VITALS — BP 116/70 | HR 55 | Temp 97.8°F | Resp 16 | Ht 69.0 in | Wt 168.0 lb

## 2015-07-14 DIAGNOSIS — Z0000 Encounter for general adult medical examination without abnormal findings: Secondary | ICD-10-CM

## 2015-07-14 NOTE — Patient Instructions (Signed)
Corns and Calluses Corns are small areas of thickened skin that occur on the top, sides, or tip of a toe. They contain a cone-shaped core with a point that can press on a nerve below. This causes pain. Calluses are areas of thickened skin that can occur anywhere on the body including hands, fingers, palms, soles of the feet, and heels.Calluses are usually larger than corns.  CAUSES  Corns and calluses are caused by rubbing (friction) or pressure, such as from shoes that are too tight or do not fit properly.  RISK FACTORS Corns are more likely to develop in people who have toe deformities, such as hammer toes. Since calluses can occur with friction to any area of the skin, calluses are more likely to develop in people who:   Work with their hands.  Wear shoes that fit poorly, shoes that are too tight, or shoes that are high-heeled.  Have toes deformities. SYMPTOMS Symptoms of a corn or callus include:  A hard growth on the skin.   Pain or tenderness under the skin.   Redness and swelling.   Increased discomfort while wearing tight-fitting shoes. DIAGNOSIS  Corns and calluses may be diagnosed with a medical history and physical exam.  TREATMENT  Corns and calluses may be treated with:  Removing the cause of the friction or pressure. This may include:  Changing your shoes.  Wearing shoe inserts (orthotics) or other protective layers in your shoes, such as a corn pad.  Wearing gloves.  Medicines to help soften skin in the hardened, thickened areas.  Reducing the size of the corn or callus by removing the dead layers of skin.  Antibiotic medicines to treat infection.  Surgery, if a toe deformity is the cause. HOME CARE INSTRUCTIONS   Take medicines only as directed by your health care provider.  If you were prescribed an antibiotic, finish all of it even if you start to feel better.  Wear shoes that fit well. Avoid wearing high-heeled shoes and shoes that are too tight  or too loose.  Wear any padding, protective layers, gloves, or orthotics as directed by your health care provider.  Soak your hands or feet and then use a file or pumice stone to soften your corn or callus. Do this as directed by your health care provider.  Check your corn or callus every day for signs of infection. Watch for:  Redness, swelling, or pain.  Fluid, blood, or pus. SEEK MEDICAL CARE IF:   Your symptoms do not improve with treatment.  You have increased redness, swelling, or pain at the site of your corn or callus.  You have fluid, blood, or pus coming from your corn or callus.  You have new symptoms.   This information is not intended to replace advice given to you by your health care provider. Make sure you discuss any questions you have with your health care provider.   Document Released: 04/15/2004 Document Revised: 11/24/2014 Document Reviewed: 07/06/2014 Elsevier Interactive Patient Education 2016 Elsevier Inc.  

## 2015-07-14 NOTE — Progress Notes (Signed)
Pre visit review using our clinic review tool, if applicable. No additional management support is needed unless otherwise documented below in the visit note. 

## 2015-07-14 NOTE — Progress Notes (Signed)
   Subjective:    Patient ID: Hunter Collins, male    DOB: 01-14-1946, 69 y.o.   MRN: [id number]  HPI Patient seen for complete physical. Generally very healthy. Runs several days per week. Averages over 20,000 steps per day on his fitbit. Never smoked. Hyperlipidemia and has declined any intervention with statins. Both of his parents died at a fairly early age. No history of known premature CAD.  Patient has never had colon cancer screening and he declines colonoscopy. He is willing to consider Cologuard.  Callused area right ball of foot and requesting this be trimmed. Sometimes pain with walking and running. This has been trimmed by podiatrist in the past  No past medical history on file. Past Surgical History  Procedure Laterality Date  . Tonsillectomy and adenoidectomy  1960    reports that he has never smoked. He has never used smokeless tobacco. He reports that he drinks alcohol. He reports that he does not use illicit drugs. family history is not on file. No Known Allergies    Review of Systems  Constitutional: Negative for fever, activity change, appetite change and fatigue.  HENT: Negative for congestion, ear pain and trouble swallowing.   Eyes: Negative for pain and visual disturbance.  Respiratory: Negative for cough, shortness of breath and wheezing.   Cardiovascular: Negative for chest pain and palpitations.  Gastrointestinal: Negative for nausea, vomiting, abdominal pain, diarrhea, constipation, blood in stool, abdominal distention and rectal pain.  Genitourinary: Negative for dysuria, hematuria and testicular pain.  Musculoskeletal: Negative for joint swelling and arthralgias.  Skin: Negative for rash.  Neurological: Negative for dizziness, syncope and headaches.  Hematological: Negative for adenopathy.  Psychiatric/Behavioral: Negative for confusion and dysphoric mood.       Objective:   Physical Exam  Constitutional: He is oriented to person, place, and  time. He appears well-developed and well-nourished. No distress.  HENT:  Head: Normocephalic and atraumatic.  Right Ear: External ear normal.  Left Ear: External ear normal.  Mouth/Throat: Oropharynx is clear and moist.  Eyes: Conjunctivae and EOM are normal. Pupils are equal, round, and reactive to light.  Neck: Normal range of motion. Neck supple. No thyromegaly present.  Cardiovascular: Normal rate, regular rhythm and normal heart sounds.   No murmur heard. Pulmonary/Chest: No respiratory distress. He has no wheezes. He has no rales.  Abdominal: Soft. Bowel sounds are normal. He exhibits no distension and no mass. There is no tenderness. There is no rebound and no guarding.  Musculoskeletal: He exhibits no edema.  Lymphadenopathy:    He has no cervical adenopathy.  Neurological: He is alert and oriented to person, place, and time. He displays normal reflexes. No cranial nerve deficit.  Skin: No rash noted.  Callused area ball right foot. No signs of plantar wart.  Psychiatric: He has a normal mood and affect.          Assessment & Plan:  Physical exam. We discussed colon cancer screening. He declines colonoscopy but does agree to look at [employer].   Labs reviewed. He declines statin therapy. We discussed low saturated and trans-fat diet. Continue regular exercise habits. We trimmed callus ball of right foot and patient tolerated well

## 2015-07-27 ENCOUNTER — Other Ambulatory Visit: Payer: Self-pay | Admitting: Surgery

## 2015-07-27 DIAGNOSIS — K4090 Unilateral inguinal hernia, without obstruction or gangrene, not specified as recurrent: Secondary | ICD-10-CM | POA: Diagnosis not present

## 2015-07-29 DIAGNOSIS — Z1212 Encounter for screening for malignant neoplasm of rectum: Secondary | ICD-10-CM | POA: Diagnosis not present

## 2015-07-29 DIAGNOSIS — Z1211 Encounter for screening for malignant neoplasm of colon: Secondary | ICD-10-CM | POA: Diagnosis not present

## 2015-08-10 ENCOUNTER — Encounter: Payer: Self-pay | Admitting: Family Medicine

## 2015-08-10 ENCOUNTER — Telehealth: Payer: Self-pay

## 2015-08-10 DIAGNOSIS — Z1211 Encounter for screening for malignant neoplasm of colon: Secondary | ICD-10-CM

## 2015-08-10 LAB — COLOGUARD: Cologuard: POSITIVE

## 2015-08-10 NOTE — Telephone Encounter (Signed)
Received Cologuard results via faxed. Outcome did show that pt is POSITIVE for CRC or advanced adenoma. Recommendation is a diagnostic colonoscopy. Please advise on further instruction.

## 2015-08-10 NOTE — Patient Instructions (Addendum)
YOUR PROCEDURE IS SCHEDULED ON :  08/13/15  REPORT TO [facility] MAIN ENTRANCE FOLLOW SIGNS TO EAST ELEVATOR - GO TO 3rd FLOOR CHECK IN AT 3 EAST NURSES STATION (SHORT STAY) AT: 5:30 AM  CALL THIS NUMBER IF YOU HAVE PROBLEMS THE MORNING OF SURGERY [phone]  REMEMBER:ONLY 1 PER PERSON MAY GO TO SHORT STAY WITH YOU TO GET READY THE MORNING OF YOUR SURGERY  DO NOT EAT FOOD OR DRINK LIQUIDS AFTER MIDNIGHT  TAKE THESE MEDICINES THE MORNING OF SURGERY: NONE  STOP ASPIRIN / IBUPROFEN / ALEVE / VITAMINS / HERBAL MEDS __5__ DAYS BEFORE SURGERY  YOU MAY NOT HAVE ANY METAL ON YOUR BODY INCLUDING HAIR PINS AND PIERCING'S. DO NOT WEAR JEWELRY, MAKEUP, LOTIONS, POWDERS OR PERFUMES. DO NOT WEAR NAIL POLISH. DO NOT SHAVE 48 HRS PRIOR TO SURGERY. MEN MAY SHAVE FACE AND NECK.  DO NOT [facility]. [facility] IS NOT RESPONSIBLE FOR VALUABLES.  CONTACTS, DENTURES OR PARTIALS MAY NOT BE WORN TO SURGERY. LEAVE SUITCASE IN CAR. CAN BE BROUGHT TO ROOM AFTER SURGERY.  PATIENTS DISCHARGED THE DAY OF SURGERY WILL NOT BE ALLOWED TO DRIVE HOME.  PLEASE READ OVER THE FOLLOWING INSTRUCTION SHEETS _________________________________________________________________________________                                          [facility] - PREPARING FOR SURGERY  Before surgery, you can play an important role.  Because skin is not sterile, your skin needs to be as free of germs as possible.  You can reduce the number of germs on your skin by washing with CHG (chlorahexidine gluconate) soap before surgery.  CHG is an antiseptic cleaner which kills germs and bonds with the skin to continue killing germs even after washing. Please DO NOT use if you have an allergy to CHG or antibacterial soaps.  If your skin becomes reddened/irritated stop using the CHG and inform your nurse when you arrive at Short Stay. Do not shave (including legs and underarms) for at least 48 hours prior to the  first CHG shower.  You may shave your face. Please follow these instructions carefully:   1.  Shower with CHG Soap the night before surgery and the  morning of Surgery.   2.  If you choose to wash your hair, wash your hair first as usual with your  normal  Shampoo.   3.  After you shampoo, rinse your hair and body thoroughly to remove the  shampoo.                                         4.  Use CHG as you would any other liquid soap.  You can apply chg directly  to the skin and wash . Gently wash with scrungie or clean wascloth    5.  Apply the CHG Soap to your body ONLY FROM THE NECK DOWN.   Do not use on open                           Wound or open sores. Avoid contact with eyes, ears mouth and genitals (private parts).  Genitals (private parts) with your normal soap.              6.  Wash thoroughly, paying special attention to the area where your surgery  will be performed.   7.  Thoroughly rinse your body with warm water from the neck down.   8.  DO NOT shower/wash with your normal soap after using and rinsing off  the CHG Soap .                9.  Pat yourself dry with a clean towel.             10.  Wear clean night clothes to bed after shower             11.  Place clean sheets on your bed the night of your first shower and do not  sleep with pets.  Day of Surgery : Do not apply any lotions/deodorants the morning of surgery.  Please wear clean clothes to the hospital/surgery center.  FAILURE TO FOLLOW THESE INSTRUCTIONS MAY RESULT IN THE CANCELLATION OF YOUR SURGERY    PATIENT SIGNATURE_________________________________  ______________________________________________________________________

## 2015-08-11 ENCOUNTER — Encounter (HOSPITAL_COMMUNITY)
Admission: RE | Admit: 2015-08-11 | Discharge: 2015-08-11 | Disposition: A | Discharge status: Home / Self Care | Payer: Commercial Managed Care - HMO | Source: Ambulatory Visit | Attending: Surgery | Admitting: Surgery

## 2015-08-11 ENCOUNTER — Encounter: Payer: Self-pay | Admitting: Internal Medicine

## 2015-08-11 ENCOUNTER — Encounter (HOSPITAL_COMMUNITY): Payer: Self-pay

## 2015-08-11 DIAGNOSIS — K4090 Unilateral inguinal hernia, without obstruction or gangrene, not specified as recurrent: Secondary | ICD-10-CM | POA: Diagnosis not present

## 2015-08-11 HISTORY — DX: Bradycardia, unspecified: R00.1

## 2015-08-11 HISTORY — DX: Unilateral inguinal hernia, without obstruction or gangrene, not specified as recurrent: K40.90

## 2015-08-11 LAB — CBC
HEMATOCRIT: 44.9 % (ref 39.0–52.0)
HEMOGLOBIN: 14.9 g/dL (ref 13.0–17.0)
MCH: 31.7 pg (ref 26.0–34.0)
MCHC: 33.2 g/dL (ref 30.0–36.0)
MCV: 95.5 fL (ref 78.0–100.0)
Platelets: 215 10*3/uL (ref 150–400)
RBC: 4.7 MIL/uL (ref 4.22–5.81)
RDW: 13.1 % (ref 11.5–15.5)
WBC: 3.9 10*3/uL — ABNORMAL LOW (ref 4.0–10.5)

## 2015-08-11 NOTE — Telephone Encounter (Signed)
Notified patient of positive Cologuard result. Recommend setting up GI referral for colonoscopy and patient agrees.

## 2015-08-12 NOTE — H&P (Signed)
Hunter Celeste. Collins 07/27/2015 1:21 PM Location: [facility] Collins #: [id number] DOB: February 17, 1946 Married / Language: English / Race: White Male   History of Present Illness (Hunter Leas A. Ninfa Linden MD; 07/27/2015 1:36 PM) Collins words: New-RIH.  Hunter Collins is a 70 year old male who presents with an inguinal hernia. This is a pleasant gentleman referred by Dr. Carolann Littler for a symptomatic right inguinal hernia. Hunter Collins reports having a hernia for several years but it is now getting larger and less easy to reduce. He still only has very mild discomfort which he describes as a dull ache from time to time but he is mostly asymptomatic. He has had no nausea or vomiting or obstructive symptoms. He is very active with both walking and running. He is otherwise without complaints   Past Surgical History Malachi Bonds, CMA; 07/27/2015 1:21 PM) Tonsillectomy Vasectomy  Diagnostic Studies History Malachi Bonds, CMA; 07/27/2015 1:21 PM) Colonoscopy never  Allergies Malachi Bonds, CMA; 07/27/2015 1:22 PM) No Known Drug Allergies01/09/2015  Medication History Malachi Bonds, CMA; 07/27/2015 1:23 PM) Vitamin D3 (5000UNIT Capsule, Oral) Active. Glucosamine Chondroitin Adv (Oral) Active. Multivitamin Adult (Oral) Active. Medications Reconciled  Social History Malachi Bonds, CMA; 07/27/2015 1:21 PM) Alcohol use Moderate alcohol use. Caffeine use Coffee. No drug use Tobacco use Never smoker.  Family History Malachi Bonds, CMA; 07/27/2015 1:21 PM) First Degree Relatives No pertinent family history    Review of Systems Malachi Bonds CMA; 07/27/2015 1:21 PM) General Not Present- Appetite Loss, Chills, Fatigue, Fever, Night Sweats, Weight Gain and Weight Loss. Skin Not Present- Change in Wart/Mole, Dryness, Hives, Jaundice, New Lesions, Non-Healing Wounds, Rash and Ulcer. HEENT Present- Wears glasses/contact lenses. Not Present- Earache, Hearing Loss, Hoarseness, Nose  Bleed, Oral Ulcers, Ringing in Hunter Ears, Seasonal Allergies, Sinus Pain, Sore Throat, Visual Disturbances and Yellow Eyes. Respiratory Not Present- Bloody sputum, Chronic Cough, Difficulty Breathing, Snoring and Wheezing. Cardiovascular Not Present- Chest Pain, Difficulty Breathing Lying Down, Leg Cramps, Palpitations, Rapid Heart Rate, Shortness of Breath and Swelling of Extremities. Gastrointestinal Present- Abdominal Pain. Not Present- Bloating, Bloody Stool, Change in Bowel Habits, Chronic diarrhea, Constipation, Difficulty Swallowing, Excessive gas, Gets full quickly at meals, Hemorrhoids, Indigestion, Nausea, Rectal Pain and Vomiting. Male Genitourinary Not Present- Blood in Urine, Change in Urinary Stream, Frequency, Impotence, Nocturia, Painful Urination, Urgency and Urine Leakage. Musculoskeletal Not Present- Back Pain, Joint Pain, Joint Stiffness, Muscle Pain, Muscle Weakness and Swelling of Extremities. Neurological Not Present- Decreased Memory, Fainting, Headaches, Numbness, Seizures, Tingling, Tremor, Trouble walking and Weakness. Psychiatric Not Present- Anxiety, Bipolar, Change in Sleep Pattern, Depression, Fearful and Frequent crying. Endocrine Not Present- Cold Intolerance, Excessive Hunger, Hair Changes, Heat Intolerance, Hot flashes and New Diabetes. Hematology Not Present- Easy Bruising, Excessive bleeding, Gland problems, HIV and Persistent Infections.  Vitals (Chemira Jones CMA; 07/27/2015 1:22 PM) 07/27/2015 1:22 PM Weight: 170 lb Height: 69in Body Surface Area: 1.93 m Body Mass Index: 25.1 kg/m  Temp.: 79F(Oral)  Pulse: 60 (Regular)  BP: 120/82 (Sitting, Left Arm, Standard)       Physical Exam (Kinnedy Mongiello A. Ninfa Linden MD; 07/27/2015 1:36 PM) General Mental Status-Alert. General Appearance-Consistent with stated age. Hydration-Well hydrated. Voice-Normal.  Head and Neck Head-normocephalic, atraumatic with no lesions or palpable  masses. Trachea-midline.  Eye Eyeball - Bilateral-Extraocular movements intact. Sclera/Conjunctiva - Bilateral-No scleral icterus.  Chest and Lung Exam Chest and lung exam reveals -quiet, even and easy respiratory effort with no use of accessory muscles and on auscultation, normal breath sounds, no adventitious sounds and normal  vocal resonance. Inspection Chest Wall - Normal. Back - normal.  Cardiovascular Cardiovascular examination reveals -normal heart sounds, regular rate and rhythm with no murmurs and normal pedal pulses bilaterally.  Abdomen Inspection Skin - Scar - no surgical scars. Hernias - Inguinal hernia - Right - Reducible. Note: It is a moderate sized slightly difficult to reduce right inguinal hernia. Palpation/Percussion Palpation and Percussion of Hunter abdomen reveal - Soft, Non Tender, No Rebound tenderness, No Rigidity (guarding) and No hepatosplenomegaly. Auscultation Auscultation of Hunter abdomen reveals - Bowel sounds normal.  Neurologic Neurologic evaluation reveals -alert and oriented x 3 with no impairment of recent or remote memory. Mental Status-Normal.  Musculoskeletal Normal Exam - Left-Upper Extremity Strength Normal and Lower Extremity Strength Normal. Normal Exam - Right-Upper Extremity Strength Normal, Lower Extremity Weakness.    Assessment & Plan (Cerissa Zeiger A. Ninfa Linden MD; 07/27/2015 1:37 PM) INGUINAL HERNIA OF RIGHT SIDE WITHOUT OBSTRUCTION OR GANGRENE (K40.90) Impression: I discussed Hunter diagnosis with him in detail. Repair with mesh of Hunter right inguinal hernia is recommended. I discussed both Hunter laparoscopic and open techniques. He wishes to proceed with a laparoscopic right inguinal hernia repair with mesh. I gave him literature regarding this. I discussed Hunter risk of surgery which includes but is not limited to bleeding, infection, injury to surrounding structures, chronic pain, nerve entrapment, use of mesh, recurrence, etc. I  discussed postoperative recovery. He understands and wishes to proceed with surgery Current Plans Pt Education - Pamphlet Given - Laparoscopic Hernia Repair: discussed with Collins and provided information.

## 2015-08-13 ENCOUNTER — Encounter (HOSPITAL_COMMUNITY): Payer: Self-pay

## 2015-08-13 ENCOUNTER — Ambulatory Visit (HOSPITAL_COMMUNITY): Payer: Commercial Managed Care - HMO | Admitting: Anesthesiology

## 2015-08-13 ENCOUNTER — Encounter (HOSPITAL_COMMUNITY)
Admission: RE | Disposition: A | Discharge status: Home / Self Care | Payer: Self-pay | Source: Ambulatory Visit | Attending: Surgery

## 2015-08-13 ENCOUNTER — Ambulatory Visit (HOSPITAL_COMMUNITY)
Admission: RE | Admit: 2015-08-13 | Discharge: 2015-08-13 | Disposition: A | Discharge status: Home / Self Care | Payer: Commercial Managed Care - HMO | Source: Ambulatory Visit | Attending: Surgery | Admitting: Surgery

## 2015-08-13 DIAGNOSIS — K4090 Unilateral inguinal hernia, without obstruction or gangrene, not specified as recurrent: Secondary | ICD-10-CM | POA: Insufficient documentation

## 2015-08-13 HISTORY — PX: INSERTION OF MESH: SHX5868

## 2015-08-13 HISTORY — PX: INGUINAL HERNIA REPAIR: SHX194

## 2015-08-13 SURGERY — REPAIR, HERNIA, INGUINAL, LAPAROSCOPIC
Anesthesia: General | Site: Groin | Laterality: Right

## 2015-08-13 MED ORDER — OXYCODONE HCL 5 MG PO TABS: 5.0000 mg | ORAL_TABLET | ORAL | Status: DC | PRN

## 2015-08-13 MED ORDER — ONDANSETRON HCL 4 MG/2ML IJ SOLN
INTRAMUSCULAR | Status: AC
  Filled 2015-08-13: qty 2

## 2015-08-13 MED ORDER — ACETAMINOPHEN 325 MG PO TABS: 650.0000 mg | ORAL_TABLET | ORAL | Status: DC | PRN

## 2015-08-13 MED ORDER — ACETAMINOPHEN 650 MG RE SUPP: 650.0000 mg | RECTAL | Status: DC | PRN

## 2015-08-13 MED ORDER — CEFAZOLIN SODIUM-DEXTROSE 2-3 GM-% IV SOLR
INTRAVENOUS | Status: AC
  Filled 2015-08-13: qty 50

## 2015-08-13 MED ORDER — PROPOFOL 10 MG/ML IV BOLUS
INTRAVENOUS | Status: DC | PRN
  Administered 2015-08-13: 150 mg via INTRAVENOUS

## 2015-08-13 MED ORDER — ACETAMINOPHEN 10 MG/ML IV SOLN
INTRAVENOUS | Status: AC
  Filled 2015-08-13: qty 100

## 2015-08-13 MED ORDER — SODIUM CHLORIDE 0.9 % IJ SOLN: 3.0000 mL | INTRAMUSCULAR | Status: DC | PRN

## 2015-08-13 MED ORDER — PROMETHAZINE HCL 25 MG/ML IJ SOLN
INTRAMUSCULAR | Status: AC
  Filled 2015-08-13: qty 1

## 2015-08-13 MED ORDER — LIDOCAINE HCL (CARDIAC) 20 MG/ML IV SOLN
INTRAVENOUS | Status: AC
  Filled 2015-08-13: qty 5

## 2015-08-13 MED ORDER — LACTATED RINGERS IV SOLN
INTRAVENOUS | Status: DC
Start: 2015-08-13 — End: 2015-08-13
  Administered 2015-08-13: 09:00:00 via INTRAVENOUS

## 2015-08-13 MED ORDER — SUGAMMADEX SODIUM 200 MG/2ML IV SOLN
INTRAVENOUS | Status: DC | PRN
  Administered 2015-08-13: 200 mg via INTRAVENOUS

## 2015-08-13 MED ORDER — SODIUM CHLORIDE 0.9 % IJ SOLN: 3.0000 mL | Freq: Two times a day (BID) | INTRAMUSCULAR | Status: DC

## 2015-08-13 MED ORDER — FENTANYL CITRATE (PF) 100 MCG/2ML IJ SOLN
INTRAMUSCULAR | Status: DC | PRN
  Administered 2015-08-13: 150 ug via INTRAVENOUS
  Administered 2015-08-13 (×2): 50 ug via INTRAVENOUS

## 2015-08-13 MED ORDER — SUCCINYLCHOLINE CHLORIDE 20 MG/ML IJ SOLN
INTRAMUSCULAR | Status: DC | PRN
  Administered 2015-08-13: 100 mg via INTRAVENOUS

## 2015-08-13 MED ORDER — SUGAMMADEX SODIUM 200 MG/2ML IV SOLN
INTRAVENOUS | Status: AC
  Filled 2015-08-13: qty 2

## 2015-08-13 MED ORDER — MORPHINE SULFATE (PF) 10 MG/ML IV SOLN: 1.0000 mg | INTRAVENOUS | Status: DC | PRN

## 2015-08-13 MED ORDER — LACTATED RINGERS IV SOLN
INTRAVENOUS | Status: DC | PRN
  Administered 2015-08-13: 07:00:00 via INTRAVENOUS

## 2015-08-13 MED ORDER — EPHEDRINE SULFATE 50 MG/ML IJ SOLN
INTRAMUSCULAR | Status: AC
  Filled 2015-08-13: qty 1

## 2015-08-13 MED ORDER — ACETAMINOPHEN 10 MG/ML IV SOLN
INTRAVENOUS | Status: DC | PRN
  Administered 2015-08-13: 1000 mg via INTRAVENOUS

## 2015-08-13 MED ORDER — ONDANSETRON HCL 4 MG/2ML IJ SOLN
INTRAMUSCULAR | Status: DC | PRN
Start: 2015-08-13 — End: 2015-08-13
  Administered 2015-08-13: 4 mg via INTRAVENOUS

## 2015-08-13 MED ORDER — DEXAMETHASONE SODIUM PHOSPHATE 10 MG/ML IJ SOLN
INTRAMUSCULAR | Status: DC | PRN
  Administered 2015-08-13: 10 mg via INTRAVENOUS

## 2015-08-13 MED ORDER — HYDROMORPHONE HCL 1 MG/ML IJ SOLN: 0.2500 mg | INTRAMUSCULAR | Status: DC | PRN

## 2015-08-13 MED ORDER — LIDOCAINE HCL (CARDIAC) 20 MG/ML IV SOLN
INTRAVENOUS | Status: DC | PRN
  Administered 2015-08-13: 100 mg via INTRAVENOUS

## 2015-08-13 MED ORDER — HYDROCODONE-ACETAMINOPHEN 5-325 MG PO TABS: 1.0000 | ORAL_TABLET | ORAL | Status: DC | PRN

## 2015-08-13 MED ORDER — BUPIVACAINE HCL (PF) 0.5 % IJ SOLN
INTRAMUSCULAR | Status: AC
  Filled 2015-08-13: qty 30

## 2015-08-13 MED ORDER — CEFAZOLIN SODIUM-DEXTROSE 2-3 GM-% IV SOLR
2.0000 g | INTRAVENOUS | Status: AC
  Administered 2015-08-13: 2 g via INTRAVENOUS

## 2015-08-13 MED ORDER — PROMETHAZINE HCL 25 MG/ML IJ SOLN
6.2500 mg | INTRAMUSCULAR | Status: DC | PRN
  Administered 2015-08-13: 6.25 mg via INTRAVENOUS

## 2015-08-13 MED ORDER — FENTANYL CITRATE (PF) 250 MCG/5ML IJ SOLN
INTRAMUSCULAR | Status: AC
  Filled 2015-08-13: qty 5

## 2015-08-13 MED ORDER — BUPIVACAINE HCL (PF) 0.5 % IJ SOLN
INTRAMUSCULAR | Status: DC | PRN
  Administered 2015-08-13: 20 mL

## 2015-08-13 MED ORDER — PROPOFOL 10 MG/ML IV BOLUS
INTRAVENOUS | Status: AC
  Filled 2015-08-13: qty 20

## 2015-08-13 MED ORDER — SODIUM CHLORIDE 0.9 % IV SOLN: 250.0000 mL | INTRAVENOUS | Status: DC | PRN

## 2015-08-13 MED ORDER — ROCURONIUM BROMIDE 100 MG/10ML IV SOLN
INTRAVENOUS | Status: DC | PRN
  Administered 2015-08-13: 20 mg via INTRAVENOUS

## 2015-08-13 MED ORDER — SODIUM CHLORIDE 0.9 % IJ SOLN
INTRAMUSCULAR | Status: AC
  Filled 2015-08-13: qty 10

## 2015-08-13 MED ORDER — DEXAMETHASONE SODIUM PHOSPHATE 10 MG/ML IJ SOLN
INTRAMUSCULAR | Status: AC
  Filled 2015-08-13: qty 2

## 2015-08-13 SURGICAL SUPPLY — 33 items
BANDAGE ADH SHEER 1  50/CT (GAUZE/BANDAGES/DRESSINGS) IMPLANT
BENZOIN TINCTURE PRP APPL 2/3 (GAUZE/BANDAGES/DRESSINGS) ×3 IMPLANT
CLOSURE WOUND 1/2 X4 (GAUZE/BANDAGES/DRESSINGS) ×1
COVER SURGICAL LIGHT HANDLE (MISCELLANEOUS) ×3 IMPLANT
DECANTER SPIKE VIAL GLASS SM (MISCELLANEOUS) IMPLANT
DEVICE SECURE STRAP 25 ABSORB (INSTRUMENTS) ×3 IMPLANT
DISSECT BALLN SPACEMKR + OVL (BALLOONS) ×3
DISSECTOR BALLN SPACEMKR + OVL (BALLOONS) ×1 IMPLANT
DISSECTOR BLUNT TIP ENDO 5MM (MISCELLANEOUS) IMPLANT
DRAPE LAPAROSCOPIC ABDOMINAL (DRAPES) ×3 IMPLANT
ELECT REM PT RETURN 9FT ADLT (ELECTROSURGICAL) ×3
ELECTRODE REM PT RTRN 9FT ADLT (ELECTROSURGICAL) ×1 IMPLANT
GLOVE BIOGEL PI IND STRL 7.0 (GLOVE) ×1 IMPLANT
GLOVE BIOGEL PI INDICATOR 7.0 (GLOVE) ×2
GLOVE SURG SIGNA 7.5 PF LTX (GLOVE) ×6 IMPLANT
GOWN STRL REUS W/TWL LRG LVL3 (GOWN DISPOSABLE) ×3 IMPLANT
GOWN STRL REUS W/TWL XL LVL3 (GOWN DISPOSABLE) ×6 IMPLANT
KIT BASIN OR (CUSTOM PROCEDURE TRAY) ×3 IMPLANT
LIQUID BAND (GAUZE/BANDAGES/DRESSINGS) ×3 IMPLANT
MARKER SKIN DUAL TIP RULER LAB (MISCELLANEOUS) ×3 IMPLANT
MESH 3DMAX 4X6 RT LRG (Mesh General) ×3 IMPLANT
NEEDLE INSUFFLATION 14GA 120MM (NEEDLE) IMPLANT
SCISSORS LAP 5X35 DISP (ENDOMECHANICALS) ×3 IMPLANT
SET IRRIG TUBING LAPAROSCOPIC (IRRIGATION / IRRIGATOR) IMPLANT
SOLUTION ANTI FOG 6CC (MISCELLANEOUS) ×3 IMPLANT
STRIP CLOSURE SKIN 1/2X4 (GAUZE/BANDAGES/DRESSINGS) ×2 IMPLANT
SUT MNCRL AB 4-0 PS2 18 (SUTURE) ×3 IMPLANT
TOWEL OR 17X26 10 PK STRL BLUE (TOWEL DISPOSABLE) ×3 IMPLANT
TRAY FOLEY W/METER SILVER 14FR (SET/KITS/TRAYS/PACK) IMPLANT
TRAY FOLEY W/METER SILVER 16FR (SET/KITS/TRAYS/PACK) ×3 IMPLANT
TRAY LAPAROSCOPIC (CUSTOM PROCEDURE TRAY) ×3 IMPLANT
TROCAR CANNULA W/PORT DUAL 5MM (MISCELLANEOUS) ×3 IMPLANT
TUBING INSUFFLATION 10FT LAP (TUBING) ×3 IMPLANT

## 2015-08-13 NOTE — Progress Notes (Signed)
Patient attempted to get up w maximum assistance. Patient wobbly. States he feels  Unsteady, esp on right. And that right knee is a little numb. Possibility of lidocaine traveling down leg. Patient placed back in bed , and will try again later.

## 2015-08-13 NOTE — Op Note (Signed)
NAMEWAKE, ACRE                ACCOUNT NO.:  [id number]  MEDICAL RECORD NO.:  [id number]  LOCATION:  [facility]                         FACILITY:  [facility]  PHYSICIAN:  Coralie Keens, M.D. DATE OF BIRTH:  1946/07/22  DATE OF PROCEDURE:  08/13/2015 DATE OF DISCHARGE:                              OPERATIVE REPORT   PREOPERATIVE DIAGNOSIS:  Right inguinal hernia.  POSTOPERATIVE DIAGNOSIS:  Right inguinal hernia.  PROCEDURE PERFORMED:  Laparoscopic right inguinal hernia repair with mesh.  ANESTHESIA:  General and 0.5% Marcaine.  ESTIMATED BLOOD LOSS:  Minimal.  FINDINGS:  The patient is found to have a very large chronically thick- walled right indirect inguinal hernia, which was repaired with a piece of 3DMax Bard Prolene mesh.  I used a large piece.  PROCEDURE IN DETAIL:  The patient was brought to the operating room, identified as Hunter Collins.  He was placed supine on the operating room table and general anesthesia was induced.  A Foley catheter was then inserted.  His abdomen was then prepped and draped in a usual sterile fashion.  I made a small incision just below the umbilicus with a scalpel.  I took this down to the fascia, which was opened just to the right of the midline.  The rectus muscle was identified and elevated. The dissecting balloon was passed underneath the rectus sheath and manipulated towards the pubis.  I then insufflated the dissecting balloon under direct vision dissecting out the preperitoneal space.  The dissecting balloon was then removed and insufflation was begun with carbon dioxide.  I then placed two 5 mm ports in the patient's lower midline both under direct vision.  I evaluated the inguinal area.  There was no evidence of left inguinal hernia.  The patient had what appeared to be a very large indirect inguinal hernia.  It was a very thick-walled sac.  There was some bowel going into hernia.  I was able to reduce the bowel easily, but the sac was  quite difficult to reduce from the cord structures.  I was finally able to completely reduce the sac.  I then brought a piece of Bard 3DMax Prolene mesh onto the field.  I used a large piece.  I placed it through the port at the umbilicus and opened as an onlay on the inguinal floor.  I then tacked it to Cooper's ligament up the medial abdominal wall and slightly laterally.  I then brought the whole hernia sac from underneath the mesh and kept it from going back up into the cord.  At this point, good coverage of the inguinal floor appeared to be achieved.  Hemostasis also appeared to be achieved.  I then removed the midline ports and saw the preperitoneal space collapsed appropriately with the mesh lying in place.  I then removed the umbilical port and closed the fascia with a figure-of-eight 0 Vicryl suture.  All incisions were then anesthetized with Marcaine.  I performed a right ilioinguinal nerve block with Marcaine.  I then closed all skin incisions with 4-0 Monocryl sutures and skin glue.  The patient tolerated the procedure well.  All the counts were correct at the end of procedure.  The  patient was then extubated in the operating room and taken in stable condition to the recovery room.     Coralie Keens, M.D.     DB/MEDQ  D:  08/13/2015  T:  08/13/2015  Job:  [id number]

## 2015-08-13 NOTE — Progress Notes (Signed)
Dr. Coralie Keens notified of getting out of bed against medical advice trying to put weight on right leg to improve sensation, he then felt his right leg give out on him and sat to the floor using both arms.  Pt's wife in room at time. Pt's vital signs stable - see flowsheet.

## 2015-08-13 NOTE — Progress Notes (Signed)
Dr. Delma Post at pt's bedside. Pt still having weakness in left left and still c/o numbness around thigh and knee area. Pt to remain in short stay and will continue to monitor pt.

## 2015-08-13 NOTE — Transfer of Care (Signed)
Immediate Anesthesia Transfer of Care Note  Patient: Hunter Collins  Procedure(s) Performed: Procedure(s): LAPAROSCOPIC RIGHT INGUINAL HERNIA WITH MESH (Right) INSERTION OF MESH (Right)  Patient Location: PACU  Anesthesia Type:General  Level of Consciousness: awake and oriented  Airway & Oxygen Therapy: Patient Spontanous Breathing and Patient connected to face mask oxygen  Post-op Assessment: Report given to RN and Post -op Vital signs reviewed and stable  Post vital signs: Reviewed and stable  Last Vitals: There were no vitals filed for this visit.  Complications: No apparent anesthesia complications

## 2015-08-13 NOTE — Anesthesia Postprocedure Evaluation (Signed)
Anesthesia Post Note  Patient: Hunter Collins  Procedure(s) Performed: Procedure(s) (LRB): LAPAROSCOPIC RIGHT INGUINAL HERNIA WITH MESH (Right) INSERTION OF MESH (Right)  Patient location during evaluation: PACU Anesthesia Type: General Level of consciousness: awake and alert Pain management: pain level controlled Vital Signs Assessment: post-procedure vital signs reviewed and stable Respiratory status: spontaneous breathing, nonlabored ventilation, respiratory function stable and patient connected to nasal cannula oxygen Cardiovascular status: blood pressure returned to baseline and stable Postop Assessment: no signs of nausea or vomiting Anesthetic complications: no Comments: He has weakness of the right quadriceps muscle (3/5 strength) and feels numbness to mid-tibia which seems to be improving with time. He received an ilioinguinal block in OR. Expect resolution with time. Dr. Ninfa Linden aware.    Last Vitals:  Filed Vitals:   08/13/15 0930 08/13/15 1342  BP: 118/72 120/72  Pulse: 52   Temp: 36.3 C 36.6 C  Resp: 12 16    Last Pain:  Filed Vitals:   08/13/15 1343  PainSc: 3                  Everest Brod J

## 2015-08-13 NOTE — Op Note (Signed)
LAPAROSCOPIC RIGHT INGUINAL HERNIA WITH MESH, INSERTION OF MESH  Procedure Note  LINUS COMITO 08/13/2015   Pre-op Diagnosis: Right Inguinal Hernia     Post-op Diagnosis: same  Procedure(s): LAPAROSCOPIC RIGHT INGUINAL HERNIA WITH MESH INSERTION OF MESH  Surgeon(s): Coralie Keens, MD  Anesthesia: General  Staff:  Circulator: Donneta Romberg, RN Scrub Person: Joellen Jersey, CST; Marilynn Rail Acree, CST  Estimated Blood Loss: Minimal                         Riona Lahti A   Date: 08/13/2015  Time: 8:06 AM

## 2015-08-13 NOTE — Progress Notes (Signed)
Re: RLE weakness, numbness s/p Laparoscopic Inguinal Hernia Repair  S:  Mr. Hunter Collins is currently comfortable and eager to go home. He does complain of RLE numbness over the anteromedial aspect of the distal thigh and anterior knee. He states the numbness is greatly improved from a few hours ago.  O: AF, VSS Neuro: Sensation intact bilaterally, mildly diminished over the anteromedial aspect of the distal thigh and knee Motor 4/5 strength of the RLE Ambulation: slow, deliberate shuffle with assistance  A/P: Mr. Hunter Collins is 70 y/o active M s/p Laparoscopic Inguinal Hernia Repair with complaint of RLE weakness and numbness.  - At this time, I currently recommend continued observation in the hospital setting until all symptoms have resolved and full strength has returned due to potential fall risk. Mr. Hunter Collins prefers to go home and states his family will be present all night to assist him with ambulation. He also states he has a wheelchair and walker at home to assist with ambulation. I expressed my concern and reiterated my recommendation. I instructed him to make the decision he felt was best and safest for him and his family.    Crissie Sickles Smith Robert, MD, [facility] Anesthesiology

## 2015-08-13 NOTE — Anesthesia Preprocedure Evaluation (Signed)
Anesthesia Evaluation  Patient identified by MRN, date of birth, ID band Patient awake    Reviewed: Allergy & Precautions, NPO status , Patient's Chart, lab work & pertinent test results  Airway Mallampati: II  TM Distance: >3 FB Neck ROM: Full    Dental no notable dental hx.    Pulmonary neg pulmonary ROS,    Pulmonary exam normal breath sounds clear to auscultation       Cardiovascular Exercise Tolerance: Good negative cardio ROS Normal cardiovascular exam Rhythm:Regular Rate:Normal     Neuro/Psych negative neurological ROS  negative psych ROS   GI/Hepatic negative GI ROS, Neg liver ROS,   Endo/Other  negative endocrine ROS  Renal/GU negative Renal ROS  negative genitourinary   Musculoskeletal negative musculoskeletal ROS (+)   Abdominal   Peds negative pediatric ROS (+)  Hematology negative hematology ROS (+)   Anesthesia Other Findings   Reproductive/Obstetrics negative OB ROS                             Anesthesia Physical Anesthesia Plan  ASA: I  Anesthesia Plan: General   Post-op Pain Management:    Induction: Intravenous  Airway Management Planned: Oral ETT  Additional Equipment:   Intra-op Plan:   Post-operative Plan: Extubation in OR  Informed Consent: I have reviewed the patients History and Physical, chart, labs and discussed the procedure including the risks, benefits and alternatives for the proposed anesthesia with the patient or authorized representative who has indicated his/her understanding and acceptance.   Dental advisory given  Plan Discussed with: CRNA  Anesthesia Plan Comments:         Anesthesia Quick Evaluation

## 2015-08-13 NOTE — Progress Notes (Signed)
Inform Dr Delma Post and Dr Ninfa Linden of numbness right leg, initially right thigh to below right knee. Currently right knee to ankle numb. Continue to monitor.

## 2015-08-13 NOTE — Progress Notes (Signed)
Dr. Zella Richer notifed of Hunter Collins's post -op situation Re: RLE weakness, numbess s/p Laparoscopic Inguinal Hernia Repair.  Pt is asking to go home and states he has family there to help him.  He still does complain of some numbness in the inner anterior knee area. Informed Dr. Zella Richer Hunter Collins strength has increased over the day and is able to stand however right leg still weak.   Orders given to teach pt how to use walker and pt to use walker at home until back to his baseline.  Hunter Collins and his wife verbalized understanding.

## 2015-08-13 NOTE — Anesthesia Procedure Notes (Signed)
Procedure Name: Intubation Date/Time: 08/13/2015 7:20 AM Performed by: Danley Danker L Patient Re-evaluated:Patient Re-evaluated prior to inductionOxygen Delivery Method: Circle system utilized Preoxygenation: Pre-oxygenation with 100% oxygen Intubation Type: IV induction Ventilation: Mask ventilation without difficulty and Oral airway inserted - appropriate to patient size Laryngoscope Size: Miller and 3 Grade View: Grade I Tube type: Oral Tube size: 8.0 mm Number of attempts: 1 Airway Equipment and Method: Stylet Placement Confirmation: ETT inserted through vocal cords under direct vision,  breath sounds checked- equal and bilateral and positive ETCO2 Secured at: 21 cm Tube secured with: Tape Dental Injury: Teeth and Oropharynx as per pre-operative assessment

## 2015-08-13 NOTE — Interval H&P Note (Signed)
History and Physical Interval Note: no change in H and P  08/13/2015 7:04 AM  Hunter Collins  has presented today for surgery, with the diagnosis of Right Inguinal Hernia  The various methods of treatment have been discussed with the patient and family. After consideration of risks, benefits and other options for treatment, the patient has consented to  Procedure(s): LAPAROSCOPIC RIGHT INGUINAL HERNIA WITH MESH (Right) INSERTION OF MESH (Right) as a surgical intervention .  The patient's history has been reviewed, patient examined, no change in status, stable for surgery.  I have reviewed the patient's chart and labs.  Questions were answered to the patient's satisfaction.     [name] A

## 2015-08-13 NOTE — Progress Notes (Signed)
No feeling of need to void. Continues to drink po fluids , and now nibbling on saltines

## 2015-08-13 NOTE — Discharge Instructions (Signed)
[facility] _______[facility]  UMBILICAL OR INGUINAL HERNIA REPAIR: POST OP INSTRUCTIONS  Always review your discharge instruction sheet given to you by the facility where your surgery was performed. IF YOU HAVE DISABILITY OR FAMILY LEAVE FORMS, YOU MUST BRING THEM TO THE OFFICE FOR PROCESSING.   DO NOT GIVE THEM TO YOUR DOCTOR.  1. A  prescription for pain medication may be given to you upon discharge.  Take your pain medication as prescribed, if needed.  If narcotic pain medicine is not needed, then you may take acetaminophen (Tylenol) or ibuprofen (Advil) as needed. 2. Take your usually prescribed medications unless otherwise directed. 3. If you need a refill on your pain medication, please contact your pharmacy.  They will contact our office to request authorization. Prescriptions will not be filled after 5 pm or on week-ends. 4. You should follow a light diet the first 24 hours after arrival home, such as soup and crackers, etc.  Be sure to include lots of fluids daily.  Resume your normal diet the day after surgery. 5. Most patients will experience some swelling and bruising around the umbilicus or in the groin and scrotum.  Ice packs and reclining will help.  Swelling and bruising can take several days to resolve.  6. It is common to experience some constipation if taking pain medication after surgery.  Increasing fluid intake and taking a stool softener (such as Colace) will usually help or prevent this problem from occurring.  A mild laxative (Milk of Magnesia or Miralax) should be taken according to package directions if there are no bowel movements after 48 hours. 7. Unless discharge instructions indicate otherwise, you may remove your bandages 24-48 hours after surgery, and you may shower at that time.  You may have steri-strips (small skin tapes) in place directly over the incision.  These strips should be left on the skin for 7-10 days.  If your surgeon used skin glue on the  incision, you may shower in 24 hours.  The glue will flake off over the next 2-3 weeks.  Any sutures or staples will be removed at the office during your follow-up visit. 8. ACTIVITIES:  You may resume regular (light) daily activities beginning the next day--such as daily self-care, walking, climbing stairs--gradually increasing activities as tolerated.  You may have sexual intercourse when it is comfortable.  Refrain from any heavy lifting or straining until approved by your doctor. a. You may drive when you are no longer taking prescription pain medication, you can comfortably wear a seatbelt, and you can safely maneuver your car and apply brakes. b. RETURN TO WORK:  __________________________________________________________ 9. You should see your doctor in the office for a follow-up appointment approximately 2-3 weeks after your surgery.  Make sure that you call for this appointment within a day or two after you arrive home to insure a convenient appointment time. 10. OTHER INSTRUCTIONS: NO LIFTING MORE THAN 15 TO 20 POUNDS FOR 3 WEEKS. __________________________________________________________________________________________________________________________________________________________________________________________  WHEN TO CALL YOUR DOCTOR: 1. Fever over 101.0 2. Inability to urinate 3. Nausea and/or vomiting     4. Extreme swelling or bruising 5. Continued bleeding from incision. 6. Increased pain, redness, or drainage from the incision  The clinic staff is available to answer your questions during regular business hours.  Please dont hesitate to call and ask to speak to one of the nurses for clinical concerns.  If you have a medical emergency, go to the nearest emergency room or call 911.  A  surgeon from [facility] is always on call at the hospital   [address] [place], [place], [place]  [address] ?  [address], [place], [place]   [address] [phone] ?  [phone] ? FAX [phone]                                                                                                                              General Anesthesia, Adult, Care After Refer to this sheet in the next few weeks. These instructions provide you with information on caring for yourself after your procedure. Your health care provider may also give you more specific instructions. Your treatment has been planned according to current medical practices, but problems sometimes occur. Call your health care provider if you have any problems or questions after your procedure. WHAT TO EXPECT AFTER THE PROCEDURE After the procedure, it is typical to experience:  Sleepiness.  Nausea and vomiting. HOME CARE INSTRUCTIONS  For the first 24 hours after general anesthesia:  Have a responsible person with you.  Do not drive a car. If you are alone, do not take public transportation.  Do not drink alcohol.  Do not take medicine that has not been prescribed by your health care provider.  Do not sign important papers or make important decisions.  You may resume a normal diet and activities as directed by your health care provider.  Change bandages (dressings) as directed.  If you have questions or problems that seem related to general anesthesia, call the hospital and ask for the anesthetist or anesthesiologist on call. SEEK MEDICAL CARE IF:  You have nausea and vomiting that continue the day after anesthesia.  You develop a rash. SEEK IMMEDIATE MEDICAL CARE IF:   You have difficulty breathing.  You have chest pain.  You have any allergic problems.   This information is not intended to replace advice given to you by your health care provider. Make sure you discuss any questions you have with your health care provider.   Document Released: 10/16/2000 Document Revised: 07/31/2014 Document Reviewed: 11/08/2011 Elsevier Interactive Patient Education [employer].

## 2015-08-16 ENCOUNTER — Encounter (HOSPITAL_COMMUNITY): Payer: Self-pay | Admitting: Surgery

## 2015-09-22 ENCOUNTER — Telehealth: Payer: Self-pay | Admitting: *Deleted

## 2015-09-22 NOTE — Telephone Encounter (Signed)
Will proceed as scheduled. Hunter Collins  

## 2015-09-22 NOTE — Telephone Encounter (Signed)
Dr Hilarie Fredrickson, This gentleman is coming for a colon direct 3-24 Friday. He had inguinal hernia repair with mesh 08-13-2015. Is it okay to proceed with his colon on 3-24?  Thanks, Lelan Pons PV

## 2015-09-22 NOTE — Telephone Encounter (Signed)
yes

## 2015-10-01 ENCOUNTER — Ambulatory Visit (AMBULATORY_SURGERY_CENTER): Payer: Self-pay | Admitting: *Deleted

## 2015-10-01 VITALS — Ht 69.0 in | Wt 172.4 lb

## 2015-10-01 DIAGNOSIS — Z1211 Encounter for screening for malignant neoplasm of colon: Secondary | ICD-10-CM

## 2015-10-01 MED ORDER — NA SULFATE-K SULFATE-MG SULF 17.5-3.13-1.6 GM/177ML PO SOLN: ORAL | Status: DC

## 2015-10-01 NOTE — Progress Notes (Signed)
No allergies to eggs or soy. No problems with anesthesia.  Pt given Emmi instructions for colonoscopy  No oxygen use  No diet drug use  

## 2015-10-15 ENCOUNTER — Ambulatory Visit (AMBULATORY_SURGERY_CENTER): Payer: Commercial Managed Care - HMO | Admitting: Internal Medicine

## 2015-10-15 ENCOUNTER — Encounter: Payer: Self-pay | Admitting: Internal Medicine

## 2015-10-15 VITALS — BP 115/74 | HR 51 | Temp 98.0°F | Resp 15 | Ht 69.0 in | Wt 172.0 lb

## 2015-10-15 DIAGNOSIS — D125 Benign neoplasm of sigmoid colon: Secondary | ICD-10-CM

## 2015-10-15 DIAGNOSIS — Z1211 Encounter for screening for malignant neoplasm of colon: Secondary | ICD-10-CM

## 2015-10-15 MED ORDER — SODIUM CHLORIDE 0.9 % IV SOLN: 500.0000 mL | INTRAVENOUS | Status: DC

## 2015-10-15 NOTE — Progress Notes (Signed)
Called to room to assist during endoscopic procedure.  Patient ID and intended procedure confirmed with present staff. Received instructions for my participation in the procedure from the performing physician.  

## 2015-10-15 NOTE — Progress Notes (Signed)
Report to PACU, RN, vss, BBS= Clear.  

## 2015-10-15 NOTE — Op Note (Signed)
[place] Patient Name: Hunter Collins Procedure Date: 10/15/2015 10:13 AM MRN: [id number] Endoscopist: Jerene Bears , MD Age: 70 Referring MD:  Date of Birth: November 14, 1945 Gender: Male Procedure:                Colonoscopy Indications:              Screening for colorectal malignant neoplasm, This                            is the patient's first colonoscopy Medicines:                Monitored Anesthesia Care Procedure:                Pre-Anesthesia Assessment:                           - Prior to the procedure, a History and Physical                            was performed, and patient medications and                            allergies were reviewed. The patient's tolerance of                            previous anesthesia was also reviewed. The risks                            and benefits of the procedure and the sedation                            options and risks were discussed with the patient.                            All questions were answered, and informed consent                            was obtained. Prior Anticoagulants: The patient has                            taken no previous anticoagulant or antiplatelet                            agents. ASA Grade Assessment: II - A patient with                            mild systemic disease. After reviewing the risks                            and benefits, the patient was deemed in                            satisfactory condition to undergo the procedure.  After obtaining informed consent, the colonoscope                            was passed under direct vision. Throughout the                            procedure, the patient's blood pressure, pulse, and                            oxygen saturations were monitored continuously. The                            Model CF-HQ190L [id number]) scope was introduced                            through the anus and advanced to the the cecum,                          identified by appendiceal orifice and ileocecal                            valve. The colonoscopy was performed without                            difficulty. The patient tolerated the procedure                            well. The quality of the bowel preparation was                            excellent. The ileocecal valve, appendiceal                            orifice, and rectum were photographed. The bowel                            preparation used was SUPREP. Scope In: 10:22:47 AM Scope Out: 10:34:30 AM Scope Withdrawal Time: 0 hours 8 minutes 37 seconds  Total Procedure Duration: 0 hours 11 minutes 43 seconds  Findings:      The digital rectal exam was normal.      A 7 mm polyp was found in the sigmoid colon. The polyp was       semi-pedunculated. The polyp was removed with a hot snare. Resection and       retrieval were complete.      Multiple small and large-mouthed diverticula were found in the sigmoid       colon, hepatic flexure and ascending colon.      Internal hemorrhoids were found during retroflexion. The hemorrhoids       were small. Complications:            No immediate complications. Estimated Blood Loss:     Estimated blood loss: none. Impression:               - One 7 mm polyp in the sigmoid colon, removed with  a hot snare. Resected and retrieved.                           - Diverticulosis in the sigmoid colon, at the                            hepatic flexure and in the ascending colon.                           - Internal hemorrhoids. Recommendation:           - Patient has a contact number available for                            emergencies. The signs and symptoms of potential                            delayed complications were discussed with the                            patient. Return to normal activities tomorrow.                            Written discharge instructions were provided to the                             patient.                           - Resume previous diet.                           - Continue present medications.                           - Await pathology results.                           - Repeat colonoscopy is recommended for                            surveillance. The colonoscopy date will be                            determined after pathology results from today's                            exam become available for review.                           - No ibuprofen, naproxen, or other non-steroidal                            anti-inflammatory drugs for 2 weeks after polyp                            removal.  Procedure Code(s):        --- Professional ---                           [phone], Colonoscopy, flexible; with removal of                            tumor(s), polyp(s), or other lesion(s) by snare                            technique CPT copyright 2016 American Medical Association. All rights reserved. Lajuan Lines. Hilarie Fredrickson, MD Jerene Bears, MD 10/15/2015 10:38:01 AM This report has been signed electronically. Number of Addenda: 0 Referring MD:      [name]

## 2015-10-15 NOTE — Patient Instructions (Signed)
YOU HAD AN ENDOSCOPIC PROCEDURE TODAY AT [facility]:   Refer to the procedure report that was given to you for any specific questions about what was found during the examination.  If the procedure report does not answer your questions, please call your gastroenterologist to clarify.  If you requested that your care partner not be given the details of your procedure findings, then the procedure report has been included in a sealed envelope for you to review at your convenience later.  YOU SHOULD EXPECT: Some feelings of bloating in the abdomen. Passage of more gas than usual.  Walking can help get rid of the air that was put into your GI tract during the procedure and reduce the bloating. If you had a lower endoscopy (such as a colonoscopy or flexible sigmoidoscopy) you may notice spotting of blood in your stool or on the toilet paper. If you underwent a bowel prep for your procedure, you may not have a normal bowel movement for a few days.  Please Note:  You might notice some irritation and congestion in your nose or some drainage.  This is from the oxygen used during your procedure.  There is no need for concern and it should clear up in a day or so.  SYMPTOMS TO REPORT IMMEDIATELY:   Following lower endoscopy (colonoscopy or flexible sigmoidoscopy):  Excessive amounts of blood in the stool  Significant tenderness or worsening of abdominal pains  Swelling of the abdomen that is new, acute  Fever of 100F or higher   For urgent or emergent issues, a gastroenterologist can be reached at any hour by calling [phone].   DIET: Your first meal following the procedure should be a small meal and then it is ok to progress to your normal diet. Heavy or fried foods are harder to digest and may make you feel nauseous or bloated.  Likewise, meals heavy in dairy and vegetables can increase bloating.  Drink plenty of fluids but you should avoid alcoholic beverages for 24  hours.  ACTIVITY:  You should plan to take it easy for the rest of today and you should NOT DRIVE or use heavy machinery until tomorrow (because of the sedation medicines used during the test).    FOLLOW UP: Our staff will call the number listed on your records the next business day following your procedure to check on you and address any questions or concerns that you may have regarding the information given to you following your procedure. If we do not reach you, we will leave a message.  However, if you are feeling well and you are not experiencing any problems, there is no need to return our call.  We will assume that you have returned to your regular daily activities without incident.  If any biopsies were taken you will be contacted by phone or by letter within the next 1-3 weeks.  Please call us at [phone] if you have not heard about the biopsies in 3 weeks.    SIGNATURES/CONFIDENTIALITY: You and/or your care partner have signed paperwork which will be entered into your electronic medical record.  These signatures attest to the fact that that the information above on your After Visit Summary has been reviewed and is understood.  Full responsibility of the confidentiality of this discharge information lies with you and/or your care-partner.   Information on polyps,diverticulosis,hemorrhoids ,and high fiber diet given to you today

## 2015-10-16 ENCOUNTER — Telehealth: Payer: Self-pay | Admitting: Physician Assistant

## 2015-10-18 ENCOUNTER — Telehealth: Payer: Self-pay | Admitting: *Deleted

## 2015-10-18 NOTE — Telephone Encounter (Signed)
  Follow up Call-  Call back number 10/15/2015  Post procedure Call Back phone  # [phone]  Permission to leave phone message Yes     Patient questions:  Do you have a fever, pain , or abdominal swelling? No. Pain Score  0 *  Have you tolerated food without any problems? Yes.    Have you been able to return to your normal activities? yes  Do you have any questions about your discharge instructions: Diet   No. Medications  No. Follow up visit  No.  Do you have questions or concerns about your Care? No.  Actions: * If pain score is 4 or above: No action needed, pain <4.

## 2015-10-19 ENCOUNTER — Encounter: Payer: Self-pay | Admitting: Internal Medicine

## 2015-10-19 ENCOUNTER — Telehealth: Payer: Self-pay | Admitting: Family Medicine

## 2015-10-19 DIAGNOSIS — L84 Corns and callosities: Secondary | ICD-10-CM

## 2015-10-19 NOTE — Telephone Encounter (Signed)
Patient needs for Dr. Elease Hashimoto to refer him to the foot doctor for a plantars wart.

## 2015-10-20 NOTE — Telephone Encounter (Signed)
Order entered. Pt is aware via voicemail.

## 2015-10-20 NOTE — Telephone Encounter (Signed)
Okay to refer-though recent exam showed a callus and not a plantar wart

## 2015-10-20 NOTE — Telephone Encounter (Signed)
Last seen on 07/14/2015 for CPE. Okay for referral?

## 2015-11-04 ENCOUNTER — Telehealth: Payer: Self-pay | Admitting: Family Medicine

## 2015-11-04 NOTE — Telephone Encounter (Signed)
Pt ins is not in network with [facility] and he would like a referral to [facility] on [address]

## 2015-11-11 ENCOUNTER — Ambulatory Visit (INDEPENDENT_AMBULATORY_CARE_PROVIDER_SITE_OTHER): Payer: Commercial Managed Care - HMO | Admitting: Podiatry

## 2015-11-11 ENCOUNTER — Encounter: Payer: Self-pay | Admitting: Podiatry

## 2015-11-11 DIAGNOSIS — Q828 Other specified congenital malformations of skin: Secondary | ICD-10-CM | POA: Diagnosis not present

## 2015-11-11 NOTE — Progress Notes (Signed)
Subjective:     Patient ID: Hunter Collins, male   DOB: [birth year], 70 y.o.   MRN: [id number]  HPI This patient presents with painful callus under right foot .  Patient is an avid runner who was born with clubfoot which has led to severe rearfoot changes in the absence of pain.  He has callus under ball of right foot.    He requests that acid be applied to help reduce his painful callus.  He presents for evaluation and treatment.  Review of Systems     Objective:   Physical Exam Objective: Review of past medical history, medications, social history and allergies were performed.  Vascular: Dorsalis pedis and posterior tibial pulses were palpable B/L, capillary refill was  WNL B/L, temperature gradient was WNL B/L   Skin:  No signs of symptoms of infection or ulcers on both feet.  Porokeratosis 2,3 right foot.  Nails: appear healthy with no signs of mycosis or infections  Sensory: Semmes Weinstein monifilament WNL   Orthopedic: Orthopedic evaluation demonstrates all joints distal t ankle have full ROM without crepitus, muscle power WNL B/L.  Plantarflexed 2,3 right foot.  Hammer toes 2-4 .             Assessment:    Porokeratosis right foot       Plan:       Debridement of porokeratosis. Marland Kitchen  RTC 1 week. Ap;plication canthacur acid.   Gardiner Barefoot DPM

## 2015-11-16 ENCOUNTER — Encounter: Payer: Self-pay | Admitting: Podiatry

## 2015-11-16 ENCOUNTER — Ambulatory Visit (INDEPENDENT_AMBULATORY_CARE_PROVIDER_SITE_OTHER): Payer: Commercial Managed Care - HMO | Admitting: Podiatry

## 2015-11-16 VITALS — BP 116/83 | HR 69 | Resp 12

## 2015-11-16 DIAGNOSIS — Q828 Other specified congenital malformations of skin: Secondary | ICD-10-CM

## 2015-11-16 DIAGNOSIS — M2041 Other hammer toe(s) (acquired), right foot: Secondary | ICD-10-CM | POA: Diagnosis not present

## 2015-11-16 DIAGNOSIS — T300 Burn of unspecified body region, unspecified degree: Secondary | ICD-10-CM

## 2015-11-16 NOTE — Progress Notes (Signed)
Subjective:     Patient ID: Hunter Collins, male   DOB: 08-Aug-1945, 70 y.o.   MRN: [id number]  HPI this patient presents to the office stating that his right forefoot has become extremely painful. He was diagnosed with a porokeratosis on April 20 and treated with acid. He says his foot has become extremely painful and difficult to bear weight since the application of the acid. He presents the office today to have this site examined and treated   Review of Systems     Objective:   Physical Exam GENERAL APPEARANCE: Alert, conversant. Appropriately groomed. No acute distress.  VASCULAR: Pedal pulses are  palpable at  [facility] and PT bilateral.  Capillary refill time is immediate to all digits,  Normal temperature gradient.  Digital hair growth is present bilateral  NEUROLOGIC: sensation is normal to 5.07 monofilament at 5/5 sites bilateral.  Light touch is intact bilateral, Muscle strength normal.  MUSCULOSKELETAL: acceptable muscle strength, tone and stability bilateral.  Intrinsic muscluature intact bilateral.  Rectus appearance of foot and digits noted bilateral.   DERMATOLOGIC: skin color, texture, and turgor are within normal limits.  No preulcerative lesions or ulcers  are seen, no interdigital maceration noted.  No open lesions present.  Digital nails are asymptomatic. No drainage noted. Porokeratosis 2,3 right foot. Hammer toes 2-4 B/L.  White necrotic tissue with blister formation at the site of the canthacur application.  No signs of redness or swelling.      Assessment:  Chemical burn secondary to canthacur application.     Plan:     ROV  Debride necrotic tissue.  Neosporin/DSD.  RTC 1 week for re-evaluation.   Gardiner Barefoot DPM

## 2015-11-18 ENCOUNTER — Ambulatory Visit: Payer: Commercial Managed Care - HMO | Admitting: Podiatry

## 2015-11-24 ENCOUNTER — Encounter: Payer: Self-pay | Admitting: Podiatry

## 2015-11-24 ENCOUNTER — Ambulatory Visit (INDEPENDENT_AMBULATORY_CARE_PROVIDER_SITE_OTHER): Payer: Commercial Managed Care - HMO | Admitting: Podiatry

## 2015-11-24 VITALS — BP 116/70 | HR 61 | Resp 12

## 2015-11-24 DIAGNOSIS — T300 Burn of unspecified body region, unspecified degree: Secondary | ICD-10-CM | POA: Diagnosis not present

## 2015-11-24 NOTE — Progress Notes (Signed)
Subjective:     Patient ID: Hunter Collins, male   DOB: 1946/01/30, 70 y.o.   MRN: [id number]  HPI this patient presents to the office stating that his right forefoot that   had become extremely painful has been painfree since his last visit to my office. He was diagnosed with a porokeratosis on April 20 and treated with acid.Marland Kitchen He presents the office today to have this site examined and treated.   Review of Systems     Objective:   Physical Exam GENERAL APPEARANCE: Alert, conversant. Appropriately groomed. No acute distress.  VASCULAR: Pedal pulses are  palpable at  [facility] and PT bilateral.  Capillary refill time is immediate to all digits,  Normal temperature gradient.  Digital hair growth is present bilateral  NEUROLOGIC: sensation is normal to 5.07 monofilament at 5/5 sites bilateral.  Light touch is intact bilateral, Muscle strength normal.  MUSCULOSKELETAL: acceptable muscle strength, tone and stability bilateral.  Intrinsic muscluature intact bilateral.  Rectus appearance of foot and digits noted bilateral.   DERMATOLOGIC: skin color, texture, and turgor are within normal limits.  No preulcerative lesions or ulcers  are seen, no interdigital maceration noted.  No open lesions present.  Digital nails are asymptomatic. No drainage noted. Porokeratosis 2,3 right foot. Hammer toes 2-4 B/L.  Normal skin has replaced the burned necrotic tissue..  No signs of redness or swelling.      Assessment:  Chemical burn secondary to canthacur application.     Plan:     ROV  Debride necrotic tissue.  Patient requested an appointment for treatment of this lesion.  RTC 9 weeks.   Gardiner Barefoot DPM

## 2015-12-03 ENCOUNTER — Ambulatory Visit: Payer: Commercial Managed Care - HMO | Admitting: Podiatry

## 2016-01-26 ENCOUNTER — Encounter: Payer: Self-pay | Admitting: Podiatry

## 2016-01-26 ENCOUNTER — Ambulatory Visit (INDEPENDENT_AMBULATORY_CARE_PROVIDER_SITE_OTHER): Payer: Commercial Managed Care - HMO | Admitting: Podiatry

## 2016-01-26 VITALS — BP 108/65 | HR 50 | Resp 14

## 2016-01-26 DIAGNOSIS — M2041 Other hammer toe(s) (acquired), right foot: Secondary | ICD-10-CM

## 2016-01-26 DIAGNOSIS — Q828 Other specified congenital malformations of skin: Secondary | ICD-10-CM | POA: Diagnosis not present

## 2016-01-26 NOTE — Progress Notes (Signed)
Subjective:     Patient ID: Hunter Collins, male   DOB: May 27, 1946, 70 y.o.   MRN: [id number]  HPI This patient presents with painful callus under right foot and corn fifth toe right foot.  Patient is an avid runner who was born with clubfoot which has led to severe rearfoot changes in the absence of pain.  He has callus under ball of right foot.  He has corn on fifth toe right foot due to fifth toe rising up.  He presents for evaluation and treatment.  Review of Systems     Objective:   Physical Exam Objective: Review of past medical history, medications, social history and allergies were performed.  Vascular: Dorsalis pedis and posterior tibial pulses were palpable B/L, capillary refill was  WNL B/L, temperature gradient was WNL B/L   Skin:  No signs of symptoms of infection or ulcers on both feet.  Porokeratosis 2,3 right foot.  Nails: appear healthy with no signs of mycosis or infections  Sensory: Semmes Weinstein monifilament WNL   Orthopedic: Orthopedic evaluation demonstrates all joints distal t ankle have full ROM without crepitus, muscle power WNL B/L.  Plantarflexed 2,3 right foot.  Hammer toes 2-4 and 5 is contracted with heloma  durum              Assessment:    Porokeratosis right foot    Hammer toe right foot    Plan:     IE.  Debridement of porokeratosis.  Debrided heloma durum  RTC  3 months   Gardiner Barefoot DPM

## 2016-01-27 DIAGNOSIS — D1801 Hemangioma of skin and subcutaneous tissue: Secondary | ICD-10-CM | POA: Diagnosis not present

## 2016-01-27 DIAGNOSIS — L814 Other melanin hyperpigmentation: Secondary | ICD-10-CM | POA: Diagnosis not present

## 2016-01-27 DIAGNOSIS — Z85828 Personal history of other malignant neoplasm of skin: Secondary | ICD-10-CM | POA: Diagnosis not present

## 2016-01-27 DIAGNOSIS — L821 Other seborrheic keratosis: Secondary | ICD-10-CM | POA: Diagnosis not present

## 2016-01-27 DIAGNOSIS — L570 Actinic keratosis: Secondary | ICD-10-CM | POA: Diagnosis not present

## 2016-04-19 ENCOUNTER — Encounter: Payer: Self-pay | Admitting: Podiatry

## 2016-04-19 ENCOUNTER — Ambulatory Visit (INDEPENDENT_AMBULATORY_CARE_PROVIDER_SITE_OTHER): Payer: Commercial Managed Care - HMO | Admitting: Podiatry

## 2016-04-19 VITALS — BP 113/66 | HR 50 | Resp 14

## 2016-04-19 DIAGNOSIS — M2041 Other hammer toe(s) (acquired), right foot: Secondary | ICD-10-CM

## 2016-04-19 DIAGNOSIS — Q828 Other specified congenital malformations of skin: Secondary | ICD-10-CM

## 2016-04-19 NOTE — Progress Notes (Signed)
Subjective:     Patient ID: Hunter Collins, male   DOB: 03-22-1946, 70 y.o.   MRN: [id number]  HPI This patient presents with painful callus under right foot and corn fifth toe right foot.  Patient is an avid runner who was born with clubfoot which has led to severe rearfoot changes in the absence of pain.  He has callus under ball of right foot.  He has corn on fifth toe right foot due to fifth toe rising up.  He presents for evaluation and treatment.  Review of Systems     Objective:   Physical Exam Objective: Review of past medical history, medications, social history and allergies were performed.  Vascular: Dorsalis pedis and posterior tibial pulses were palpable B/L, capillary refill was  WNL B/L, temperature gradient was WNL B/L   Skin:  No signs of symptoms of infection or ulcers on both feet.  Porokeratosis 2,3 right foot.  Nails: appear healthy with no signs of mycosis or infections  Sensory: Semmes Weinstein monifilament WNL   Orthopedic: Orthopedic evaluation demonstrates all joints distal t ankle have full ROM without crepitus, muscle power WNL B/L.  Plantarflexed 2,3 right foot.  Hammer toes 2-4 and 5 is contracted with heloma  durum              Assessment:    Porokeratosis right foot    Hammer toe right foot    Plan:     IE.  Debridement of porokeratosis.  Debrided heloma durum  RTC  3 months   Gardiner Barefoot DPM

## 2016-05-17 ENCOUNTER — Telehealth: Payer: Self-pay | Admitting: Family Medicine

## 2016-05-17 ENCOUNTER — Ambulatory Visit (INDEPENDENT_AMBULATORY_CARE_PROVIDER_SITE_OTHER): Payer: Commercial Managed Care - HMO | Admitting: Family Medicine

## 2016-05-17 ENCOUNTER — Encounter: Payer: Self-pay | Admitting: Podiatry

## 2016-05-17 ENCOUNTER — Ambulatory Visit (INDEPENDENT_AMBULATORY_CARE_PROVIDER_SITE_OTHER): Payer: Commercial Managed Care - HMO | Admitting: Podiatry

## 2016-05-17 VITALS — BP 108/73 | HR 52 | Resp 14

## 2016-05-17 DIAGNOSIS — Z23 Encounter for immunization: Secondary | ICD-10-CM

## 2016-05-17 DIAGNOSIS — M7751 Other enthesopathy of right foot: Secondary | ICD-10-CM | POA: Diagnosis not present

## 2016-05-17 NOTE — Telephone Encounter (Signed)
Patient wants to get a referral to [place].  [place] [facility] [address] [place], [place] [address]  Ph: [phone]

## 2016-05-17 NOTE — Progress Notes (Signed)
Subjective:     Patient ID: Hunter Collins, male   DOB: 11-Jul-1946, 70 y.o.   MRN: [id number]  HPI this patient presents to the office stating that his right forefoot has become extremely painful.  He says he has pain walking and wearing his shoes. He is very active. He has severe hammertoes with plantar flexed metatarsals with poor keratoses noted. He has been treated with acid for the porokeratosis. He presents the office today stating he has extreme pain and discomfort under the ball of his right foot   Review of Systems     Objective:   Physical Exam GENERAL APPEARANCE: Alert, conversant. Appropriately groomed. No acute distress.  VASCULAR: Pedal pulses are  palpable at  [facility] and PT bilateral.  Capillary refill time is immediate to all digits,  Normal temperature gradient.  Digital hair growth is present bilateral  NEUROLOGIC: sensation is normal to 5.07 monofilament at 5/5 sites bilateral.  Light touch is intact bilateral, Muscle strength normal.  MUSCULOSKELETAL: acceptable muscle strength, tone and stability bilateral.  Intrinsic muscluature intact bilateral.  Rectus appearance of foot and digits noted bilateral.  Severe hammertoe deformity 2 through 5 of the right foot with plantarflexed metatarsals 2 through 4 of the right foot .  Palpable pain sub 2 right foot.  DERMATOLOGIC: skin color, texture, and turgor are within normal limits.  No preulcerative lesions or ulcers  are seen, no interdigital maceration noted.  No open lesions present.  Digital nails are asymptomatic. No drainage noted.  Asymptomatic porokeratosis  Right forefoot..      Assessment:  Capsulitis sub 2 right foot.     Plan:     ROV  Injection therapy second MPJ  Right foot. RTC prn   Gardiner Barefoot DPM

## 2016-05-18 NOTE — Telephone Encounter (Signed)
Pt was last seen on 07/13/2016 for CPE and aware that you are out of office until 10/30. Please review and advise.

## 2016-05-19 ENCOUNTER — Ambulatory Visit: Payer: Commercial Managed Care - HMO | Admitting: Family Medicine

## 2016-05-21 NOTE — Telephone Encounter (Signed)
OK to refer.

## 2016-05-22 NOTE — Addendum Note (Signed)
Addended by: Elio Forget on: 05/22/2016 08:37 AM   Modules accepted: Orders

## 2016-05-22 NOTE — Telephone Encounter (Signed)
Order entered for referral.  

## 2016-07-07 ENCOUNTER — Other Ambulatory Visit (INDEPENDENT_AMBULATORY_CARE_PROVIDER_SITE_OTHER): Payer: Commercial Managed Care - HMO

## 2016-07-07 DIAGNOSIS — Z0000 Encounter for general adult medical examination without abnormal findings: Secondary | ICD-10-CM | POA: Diagnosis not present

## 2016-07-07 DIAGNOSIS — Z125 Encounter for screening for malignant neoplasm of prostate: Secondary | ICD-10-CM | POA: Diagnosis not present

## 2016-07-07 LAB — TSH: TSH: 2.3 u[IU]/mL (ref 0.35–4.50)

## 2016-07-07 LAB — LIPID PANEL
CHOL/HDL RATIO: 6
Cholesterol: 271 mg/dL — ABNORMAL HIGH (ref 0–200)
HDL: 46.8 mg/dL (ref >39.00)
LDL Cholesterol: 198 mg/dL — ABNORMAL HIGH (ref 0–99)
NONHDL: 223.98
Triglycerides: 132 mg/dL (ref 0.0–149.0)
VLDL: 26.4 mg/dL (ref 0.0–40.0)

## 2016-07-07 LAB — HEPATIC FUNCTION PANEL
ALBUMIN: 4.3 g/dL (ref 3.5–5.2)
ALK PHOS: 55 U/L (ref 39–117)
ALT: 16 U/L (ref 0–53)
AST: 16 U/L (ref 0–37)
BILIRUBIN DIRECT: 0.1 mg/dL (ref 0.0–0.3)
TOTAL PROTEIN: 6.4 g/dL (ref 6.0–8.3)
Total Bilirubin: 0.8 mg/dL (ref 0.2–1.2)

## 2016-07-07 LAB — CBC WITH DIFFERENTIAL/PLATELET
BASOS ABS: 0 10*3/uL (ref 0.0–0.1)
Basophils Relative: 0.6 % (ref 0.0–3.0)
EOS ABS: 0.1 10*3/uL (ref 0.0–0.7)
Eosinophils Relative: 1.5 % (ref 0.0–5.0)
HEMATOCRIT: 45.2 % (ref 39.0–52.0)
Hemoglobin: 15.5 g/dL (ref 13.0–17.0)
LYMPHS PCT: 38.1 % (ref 12.0–46.0)
Lymphs Abs: 1.9 10*3/uL (ref 0.7–4.0)
MCHC: 34.2 g/dL (ref 30.0–36.0)
MCV: 95.1 fl (ref 78.0–100.0)
MONOS PCT: 9.5 % (ref 3.0–12.0)
Monocytes Absolute: 0.5 10*3/uL (ref 0.1–1.0)
Neutro Abs: 2.5 10*3/uL (ref 1.4–7.7)
Neutrophils Relative %: 50.3 % (ref 43.0–77.0)
Platelets: 215 10*3/uL (ref 150.0–400.0)
RBC: 4.75 Mil/uL (ref 4.22–5.81)
RDW: 13.9 % (ref 11.5–15.5)
WBC: 5 10*3/uL (ref 4.0–10.5)

## 2016-07-07 LAB — BASIC METABOLIC PANEL
BUN: 18 mg/dL (ref 6–23)
CALCIUM: 9.4 mg/dL (ref 8.4–10.5)
CO2: 32 mEq/L (ref 19–32)
CREATININE: 1.04 mg/dL (ref 0.40–1.50)
Chloride: 106 mEq/L (ref 96–112)
GFR: 74.45 mL/min (ref >60.00)
Glucose, Bld: 92 mg/dL (ref 70–99)
Potassium: 4.4 mEq/L (ref 3.5–5.1)
SODIUM: 142 meq/L (ref 135–145)

## 2016-07-07 LAB — PSA: PSA: 1.13 ng/mL (ref 0.10–4.00)

## 2016-07-12 ENCOUNTER — Ambulatory Visit (INDEPENDENT_AMBULATORY_CARE_PROVIDER_SITE_OTHER): Payer: Commercial Managed Care - HMO | Admitting: Family Medicine

## 2016-07-12 ENCOUNTER — Encounter: Payer: Self-pay | Admitting: Family Medicine

## 2016-07-12 VITALS — BP 110/80 | HR 74 | Temp 97.8°F | Ht 69.0 in | Wt 176.2 lb

## 2016-07-12 DIAGNOSIS — M25561 Pain in right knee: Secondary | ICD-10-CM

## 2016-07-12 DIAGNOSIS — Z0001 Encounter for general adult medical examination with abnormal findings: Secondary | ICD-10-CM | POA: Diagnosis not present

## 2016-07-12 DIAGNOSIS — Z0000 Encounter for general adult medical examination without abnormal findings: Secondary | ICD-10-CM

## 2016-07-12 NOTE — Progress Notes (Signed)
Subjective:     Patient ID: Hunter Collins, male   DOB: 10-12-1945, 70 y.o.   MRN: [id number]  HPI   Patient seen for complete physical. He takes no regular medications. He stays very active. His current job involves walking about 5-6 miles per day and he also walks about 3 miles per day separate from that. He has had some progressive right knee pains. He has been a ling distance runner for about 40 years and has run 84 marathons. He's had remote history of arthroscopic surgery on his right knee in the past. He needs Pneumovax. Otherwise, immunizations up-to-date. Had colonoscopy last year with one adenomatous polyp. Never smoked.  Past Medical History:  Diagnosis Date  . Allergy   . Bradycardia    " MY HEART RATE IS USUALLY AROUD 50 - I EXERCISE A LOT"  . Inguinal hernia    RIGHT   Past Surgical History:  Procedure Laterality Date  . CLOSED REDUCTION ELBOW DISLOCATION     L ELBOW  . INGUINAL HERNIA REPAIR Right 08/13/2015   Procedure: LAPAROSCOPIC RIGHT INGUINAL HERNIA WITH MESH;  Surgeon: Coralie Keens, MD;  Location: [facility];  Service: General;  Laterality: Right;  . INSERTION OF MESH Right 08/13/2015   Procedure: INSERTION OF MESH;  Surgeon: Coralie Keens, MD;  Location: [facility];  Service: General;  Laterality: Right;  . KNEE ARTHROSCOPY  2002   RT KNEE  . TONSILLECTOMY AND ADENOIDECTOMY  1960    reports that he has never smoked. His smokeless tobacco use includes Chew. He reports that he drinks about 1.8 oz of alcohol per week . He reports that he does not use drugs. family history includes Heart attack in his mother; Pancreatic cancer in his brother. No Known Allergies   Review of Systems  Constitutional: Negative for activity change, appetite change, fatigue and fever.  HENT: Negative for congestion, ear pain and trouble swallowing.   Eyes: Negative for pain and visual disturbance.  Respiratory: Negative for cough, shortness of breath and wheezing.   Cardiovascular:  Negative for chest pain and palpitations.  Gastrointestinal: Negative for abdominal distention, abdominal pain, blood in stool, constipation, diarrhea, nausea, rectal pain and vomiting.  Genitourinary: Negative for dysuria, hematuria and testicular pain.  Musculoskeletal: Positive for arthralgias. Negative for joint swelling.  Skin: Negative for rash.  Neurological: Negative for dizziness, syncope and headaches.  Hematological: Negative for adenopathy.  Psychiatric/Behavioral: Negative for confusion and dysphoric mood.       Objective:   Physical Exam  Constitutional: He is oriented to person, place, and time. He appears well-developed and well-nourished. No distress.  HENT:  Head: Normocephalic and atraumatic.  Right Ear: External ear normal.  Left Ear: External ear normal.  Mouth/Throat: Oropharynx is clear and moist.  Eyes: Conjunctivae and EOM are normal. Pupils are equal, round, and reactive to light.  Neck: Normal range of motion. Neck supple. No thyromegaly present.  Cardiovascular: Normal rate, regular rhythm and normal heart sounds.   No murmur heard. Pulmonary/Chest: No respiratory distress. He has no wheezes. He has no rales.  Abdominal: Soft. Bowel sounds are normal. He exhibits no distension and no mass. There is no tenderness. There is no rebound and no guarding.  Musculoskeletal: He exhibits no edema.  Crepitus with flexion and extension of right knee. No effusion. No warmth. Mild medial joint line tenderness  Lymphadenopathy:    He has no cervical adenopathy.  Neurological: He is alert and oriented to person, place, and time. He displays normal  reflexes. No cranial nerve deficit.  Skin: No rash noted.  Psychiatric: He has a normal mood and affect.       Assessment:     Physical exam. Labs reviewed. He has cholesterol 271 with LDL 198. Other labs normal.  Progressive right knee pain and stiffness probably secondary to osteoarthritis.  He is maintaining high levels  of physical activity.    Plan:     -We had a long discussion with patient regarding pros and cons of statin use. His ten-year risk for CAD event is 18.8%. He declines statin use at this time. -Pneumovax given -Obtain x-rays right knee to help assess degree of osteoarthritis -Continue regular aerobic exercise -Continue with yearly flu vaccine -Follow-up colonoscopy in 4 years  Hunter Post MD [facility]

## 2016-07-12 NOTE — Progress Notes (Signed)
Pre visit review using our clinic review tool, if applicable. No additional management support is needed unless otherwise documented below in the visit note. 

## 2016-07-14 ENCOUNTER — Encounter: Payer: Commercial Managed Care - HMO | Admitting: Family Medicine

## 2016-07-14 ENCOUNTER — Ambulatory Visit (INDEPENDENT_AMBULATORY_CARE_PROVIDER_SITE_OTHER)
Admission: RE | Admit: 2016-07-14 | Discharge: 2016-07-14 | Disposition: A | Discharge status: Home / Self Care | Payer: Commercial Managed Care - HMO | Source: Ambulatory Visit | Attending: Family Medicine | Admitting: Family Medicine

## 2016-07-14 DIAGNOSIS — M179 Osteoarthritis of knee, unspecified: Secondary | ICD-10-CM | POA: Diagnosis not present

## 2016-07-14 DIAGNOSIS — M25561 Pain in right knee: Secondary | ICD-10-CM | POA: Diagnosis not present

## 2016-07-18 ENCOUNTER — Encounter: Payer: Commercial Managed Care - HMO | Admitting: Family Medicine

## 2016-07-18 ENCOUNTER — Other Ambulatory Visit: Payer: Self-pay

## 2016-07-18 DIAGNOSIS — M1712 Unilateral primary osteoarthritis, left knee: Secondary | ICD-10-CM

## 2016-07-19 ENCOUNTER — Encounter: Payer: Self-pay | Admitting: Podiatry

## 2016-07-19 ENCOUNTER — Ambulatory Visit: Payer: Commercial Managed Care - HMO | Admitting: Podiatry

## 2016-07-19 ENCOUNTER — Ambulatory Visit (INDEPENDENT_AMBULATORY_CARE_PROVIDER_SITE_OTHER): Payer: Commercial Managed Care - HMO | Admitting: Podiatry

## 2016-07-19 VITALS — Ht 69.0 in | Wt 176.0 lb

## 2016-07-19 DIAGNOSIS — Q828 Other specified congenital malformations of skin: Secondary | ICD-10-CM

## 2016-07-19 NOTE — Progress Notes (Signed)
Subjective:     Patient ID: Hunter Collins, male   DOB: 1946-03-28, 70 y.o.   MRN: [id number]  HPI this patient presents to the office stating that his right forefoot has become extremely painful. Marland Kitchen He says his foot has become extremely painful and difficult to bear weight since the application of the acid. He presents the office today to have this site examined and treated.  He requests acid treatment.   Review of Systems     Objective:   Physical Exam GENERAL APPEARANCE: Alert, conversant. Appropriately groomed. No acute distress.  VASCULAR: Pedal pulses are  palpable at  [facility] and PT bilateral.  Capillary refill time is immediate to all digits,  Normal temperature gradient.  Digital hair growth is present bilateral  NEUROLOGIC: sensation is normal to 5.07 monofilament at 5/5 sites bilateral.  Light touch is intact bilateral, Muscle strength normal.  MUSCULOSKELETAL: acceptable muscle strength, tone and stability bilateral.  Intrinsic muscluature intact bilateral.  Rectus appearance of foot and digits noted bilateral.   DERMATOLOGIC: skin color, texture, and turgor are within normal limits.  No preulcerative lesions or ulcers  are seen, no interdigital maceration noted.  No open lesions present.  Digital nails are asymptomatic. No drainage noted. Porokeratosis 2,3 right foot. Hammer toes 2-4 B/L.        Assessment:  Porokeratosis right forefoot.     Plan:     ROV  Debride porokeratosis  RTC prn   Gardiner Barefoot DPM

## 2016-07-25 ENCOUNTER — Encounter (INDEPENDENT_AMBULATORY_CARE_PROVIDER_SITE_OTHER): Payer: Self-pay | Admitting: Orthopaedic Surgery

## 2016-07-25 ENCOUNTER — Ambulatory Visit (INDEPENDENT_AMBULATORY_CARE_PROVIDER_SITE_OTHER): Payer: Commercial Managed Care - HMO | Admitting: Orthopaedic Surgery

## 2016-07-25 DIAGNOSIS — M1711 Unilateral primary osteoarthritis, right knee: Secondary | ICD-10-CM | POA: Diagnosis not present

## 2016-07-25 NOTE — Progress Notes (Signed)
Office Visit Note   Patient: Hunter Collins           Date of Birth: 1946/01/12           MRN: [id number] Visit Date: 07/25/2016              Requested by: [name], MD [place], [place] [address] PCP: [name], MD   Assessment & Plan: Visit Diagnoses:  1. Unilateral primary osteoarthritis, right knee     Plan: Patient seems to be getting around quite well. I did perform an intra-articular injection today hopefully this will give him some relief. I think he is doing quite well and his activity level has not dropped off so I don't think he is really considering a partial knee replacement. I will see him back as needed.  Follow-Up Instructions: Return if symptoms worsen or fail to improve.   Orders:  No orders of the defined types were placed in this encounter.  No orders of the defined types were placed in this encounter.     Procedures: Large Joint Inj Date/Time: 07/25/2016 6:52 PM Performed by: Leandrew Koyanagi Authorized by: Leandrew Koyanagi   Consent Given by:  Patient Timeout: prior to procedure the correct patient, procedure, and site was verified   Indications:  Pain Location:  Knee Site:  R knee Prep: patient was prepped and draped in usual sterile fashion   Needle Size:  22 G Ultrasound Guidance: No   Fluoroscopic Guidance: No   Arthrogram: No   Patient tolerance:  Patient tolerated the procedure well with no immediate complications     Clinical Data: No additional findings.   Subjective: Chief Complaint  Patient presents with  . Right Knee - Pain    Patient is a very pleasant 71 year old gentleman who had arthroscopic right knee surgery about 12 years ago. He is very active and still runs and walks. He does have moderate pain of his right knee that's localized almost entirely medial. The pain is worse with increased activity and does not radiate and is worse with going upstairs. He denies any previous injuries or  constitutional symptoms. He takes Advil currently with partial relief. Denies any significant mechanical symptoms    Review of Systems Complete review of systems negative except for history of present illness  Objective: Vital Signs: There were no vitals taken for this visit.  Physical Exam Well-developed nourished acute distress alert 3 nonlabored breathing normal judgments affect abdomen soft no lymphadenopathy Ortho Exam Exam of the right knee shows no joint effusion and excellent range of motion. He does have prominent osteophytes on the medial side. Medial joint line is slightly tender. Collaterals and cruciates are stable. Specialty Comments:  No specialty comments available.  Imaging: No results found.   PMFS History: Patient Active Problem List   Diagnosis Date Noted  . Unilateral primary osteoarthritis, right knee 07/25/2016  . HYPERLIPIDEMIA 04/12/2009   Past Medical History:  Diagnosis Date  . Allergy   . Bradycardia    " MY HEART RATE IS USUALLY AROUD 50 - I EXERCISE A LOT"  . Inguinal hernia    RIGHT    Family History  Problem Relation Age of Onset  . Heart attack Mother   . Pancreatic cancer Brother   . Colon cancer Neg Hx     Past Surgical History:  Procedure Laterality Date  . CLOSED REDUCTION ELBOW DISLOCATION     L ELBOW  . INGUINAL HERNIA REPAIR Right  08/13/2015   Procedure: LAPAROSCOPIC RIGHT INGUINAL HERNIA WITH MESH;  Surgeon: Coralie Keens, MD;  Location: [facility];  Service: General;  Laterality: Right;  . INSERTION OF MESH Right 08/13/2015   Procedure: INSERTION OF MESH;  Surgeon: Coralie Keens, MD;  Location: [facility];  Service: General;  Laterality: Right;  . KNEE ARTHROSCOPY  2002   RT KNEE  . TONSILLECTOMY AND ADENOIDECTOMY  1960   Social History   Occupational History  . Not on file.   Social History Main Topics  . Smoking status: Never Smoker  . Smokeless tobacco: Current User    Types: Chew  . Alcohol use 1.8 oz/week    3  Shots of liquor per week     Comment: weekends  . Drug use: No  . Sexual activity: Not on file

## 2016-07-27 DIAGNOSIS — D485 Neoplasm of uncertain behavior of skin: Secondary | ICD-10-CM | POA: Diagnosis not present

## 2016-07-27 DIAGNOSIS — C4441 Basal cell carcinoma of skin of scalp and neck: Secondary | ICD-10-CM | POA: Diagnosis not present

## 2016-07-27 DIAGNOSIS — L814 Other melanin hyperpigmentation: Secondary | ICD-10-CM | POA: Diagnosis not present

## 2016-07-27 DIAGNOSIS — L570 Actinic keratosis: Secondary | ICD-10-CM | POA: Diagnosis not present

## 2016-07-27 DIAGNOSIS — Z85828 Personal history of other malignant neoplasm of skin: Secondary | ICD-10-CM | POA: Diagnosis not present

## 2016-07-27 DIAGNOSIS — L821 Other seborrheic keratosis: Secondary | ICD-10-CM | POA: Diagnosis not present

## 2016-07-27 DIAGNOSIS — D1801 Hemangioma of skin and subcutaneous tissue: Secondary | ICD-10-CM | POA: Diagnosis not present

## 2016-10-19 DIAGNOSIS — C4441 Basal cell carcinoma of skin of scalp and neck: Secondary | ICD-10-CM | POA: Diagnosis not present

## 2016-11-24 ENCOUNTER — Ambulatory Visit (INDEPENDENT_AMBULATORY_CARE_PROVIDER_SITE_OTHER): Payer: Medicare HMO | Admitting: Podiatry

## 2016-11-24 ENCOUNTER — Encounter: Payer: Self-pay | Admitting: Podiatry

## 2016-11-24 DIAGNOSIS — Q828 Other specified congenital malformations of skin: Secondary | ICD-10-CM | POA: Diagnosis not present

## 2016-11-24 DIAGNOSIS — M129 Arthropathy, unspecified: Secondary | ICD-10-CM

## 2016-11-24 NOTE — Progress Notes (Signed)
Subjective:     Patient ID: Hunter Collins, male   DOB: November 11, 1945, 71 y.o.   MRN: [id number]  HPI this patient presents to the office stating that he has painful callus on both feet.  He has callus in the middle of left arch and corn fifth toe right foot.  He says they are painful walking and wearing his shoes.  He presents for evaluation and treatment.   Review of Systems     Objective:   Physical Exam GENERAL APPEARANCE: Alert, conversant. Appropriately groomed. No acute distress.  VASCULAR: Pedal pulses are  palpable at  [facility] and PT bilateral.  Capillary refill time is immediate to all digits,  Normal temperature gradient.  Digital hair growth is present bilateral  NEUROLOGIC: sensation is normal to 5.07 monofilament at 5/5 sites bilateral.  Light touch is intact bilateral, Muscle strength normal.  MUSCULOSKELETAL: acceptable muscle strength, tone and stability bilateral.  Arthritis left foot at rearfoot.  DERMATOLOGIC: skin color, texture, and turgor are within normal limits.  No preulcerative lesions or ulcers  are seen, no interdigital maceration noted.  No open lesions present.  Digital nails are asymptomatic. No drainage noted. Porokeratosis 2,3 right foot. Hammer toes 2-4 B/L.  [place] fifth toe right foot.  Porokeratosis left foot under arthritic joint left foot.      Assessment:  Porokeratosis left foot  [place] durum fifth toe right foot.    Plan:     ROV  Debride porokeratosis  Debride corn  RTC prn  Discussed possible appointment with Liliane Channel if callus left foot persists and is painful.   Gardiner Barefoot DPM

## 2016-11-24 NOTE — Progress Notes (Signed)
Subjective:     Patient ID: Hunter Collins, male   DOB: 1946/03/22, 71 y.o.   MRN: [id number]  HPI this patient presents to the office stating that he has developed a callus on the arch left foot and painful corn fifth toe right foot.  These area are painful walking and wearing his shoes.  He desires evaluation and treatment.   Review of Systems     Objective:   Physical Exam GENERAL APPEARANCE: Alert, conversant. Appropriately groomed. No acute distress.  VASCULAR: Pedal pulses are  palpable at  [facility] and PT bilateral.  Capillary refill time is immediate to all digits,  Normal temperature gradient.  Digital hair growth is present bilateral  NEUROLOGIC: sensation is normal to 5.07 monofilament at 5/5 sites bilateral.  Light touch is intact bilateral, Muscle strength normal.  MUSCULOSKELETAL: acceptable muscle strength, tone and stability bilateral.  Intrinsic muscluature intact bilateral.  Rectus appearance of foot and digits noted bilateral.   DERMATOLOGIC: skin color, texture, and turgor are within normal limits.  No preulcerative lesions or ulcers  are seen, no interdigital maceration noted.  No open lesions present.  Digital nails are asymptomatic. No drainage noted. Porokeratosis 2,3 right foot. Hammer toes 2-4 B/L.        Assessment:  Porokeratosis right forefoot.     Plan:     ROV  Debride porokeratosis  RTC prn   Gardiner Barefoot DPM

## 2017-02-14 DIAGNOSIS — D1801 Hemangioma of skin and subcutaneous tissue: Secondary | ICD-10-CM | POA: Diagnosis not present

## 2017-02-14 DIAGNOSIS — Z85828 Personal history of other malignant neoplasm of skin: Secondary | ICD-10-CM | POA: Diagnosis not present

## 2017-02-14 DIAGNOSIS — L570 Actinic keratosis: Secondary | ICD-10-CM | POA: Diagnosis not present

## 2017-02-14 DIAGNOSIS — L821 Other seborrheic keratosis: Secondary | ICD-10-CM | POA: Diagnosis not present

## 2017-02-14 DIAGNOSIS — L814 Other melanin hyperpigmentation: Secondary | ICD-10-CM | POA: Diagnosis not present

## 2017-04-27 ENCOUNTER — Encounter: Payer: Self-pay | Admitting: Podiatry

## 2017-04-27 ENCOUNTER — Ambulatory Visit (INDEPENDENT_AMBULATORY_CARE_PROVIDER_SITE_OTHER): Payer: Medicare HMO | Admitting: Podiatry

## 2017-04-27 DIAGNOSIS — Q828 Other specified congenital malformations of skin: Secondary | ICD-10-CM

## 2017-04-27 NOTE — Progress Notes (Signed)
Subjective:     Patient ID: Hunter Collins, male   DOB: [birth year], 71 y.o.   MRN: [id number]  HPI this patient presents to the office stating that he has developed a callus on the arch left foot and painful corn fifth toe right foot.  These area are painful walking and wearing his shoes.  He desires evaluation and treatment.   Review of Systems     Objective:   Physical Exam GENERAL APPEARANCE: Alert, conversant. Appropriately groomed. No acute distress.  VASCULAR: Pedal pulses are  palpable at  [facility] and PT bilateral.  Capillary refill time is immediate to all digits,  Normal temperature gradient.  Digital hair growth is present bilateral  NEUROLOGIC: sensation is normal to 5.07 monofilament at 5/5 sites bilateral.  Light touch is intact bilateral, Muscle strength normal.  MUSCULOSKELETAL: acceptable muscle strength, tone and stability bilateral.  Intrinsic muscluature intact bilateral.  Rectus appearance of foot and digits noted bilateral. End stage PTTD left foot. Hammer toe 5th right foot.  DERMATOLOGIC: skin color, texture, and turgor are within normal limits.  No preulcerative lesions or ulcers  are seen, no interdigital maceration noted.  No open lesions present.  Digital nails are asymptomatic. No drainage noted. Porokeratosis 2,3 right foot. Hammer toes 2-4 B/L.  Hammer toe 5th right.      Assessment:  Porokeratosis right forefoot.  [place] durum 5th right.    Plan:     ROV  Debride porokeratosis  RTC prn   Gardiner Barefoot DPM

## 2017-04-30 ENCOUNTER — Ambulatory Visit (INDEPENDENT_AMBULATORY_CARE_PROVIDER_SITE_OTHER): Payer: Medicare HMO | Admitting: Orthopaedic Surgery

## 2017-04-30 ENCOUNTER — Encounter (INDEPENDENT_AMBULATORY_CARE_PROVIDER_SITE_OTHER): Payer: Self-pay | Admitting: Orthopaedic Surgery

## 2017-04-30 DIAGNOSIS — M1711 Unilateral primary osteoarthritis, right knee: Secondary | ICD-10-CM | POA: Diagnosis not present

## 2017-04-30 MED ORDER — BUPIVACAINE HCL 0.5 % IJ SOLN
2.0000 mL | INTRAMUSCULAR | Status: AC | PRN
  Administered 2017-04-30: 2 mL via INTRA_ARTICULAR

## 2017-04-30 MED ORDER — LIDOCAINE HCL 1 % IJ SOLN
2.0000 mL | INTRAMUSCULAR | Status: AC | PRN
  Administered 2017-04-30: 2 mL

## 2017-04-30 MED ORDER — METHYLPREDNISOLONE ACETATE 40 MG/ML IJ SUSP
40.0000 mg | INTRAMUSCULAR | Status: AC | PRN
  Administered 2017-04-30: 40 mg via INTRA_ARTICULAR

## 2017-04-30 NOTE — Progress Notes (Signed)
Office Visit Note   Patient: Hunter Collins           Date of Birth: September 29, 1945           MRN: [id number] Visit Date: 04/30/2017              Requested by: [name], MD [place], [place] [address] PCP: [name], MD   Assessment & Plan: Visit Diagnoses:  1. Unilateral primary osteoarthritis, right knee     Plan: Patient has end-stage medial compartment degenerative joint disease of his right knee. His activity level is well-known maintained. We provided him with another cortisone injection today. We will submit preapproval for monovisc injection. Follow up for the monovisc  Follow-Up Instructions: Return if symptoms worsen or fail to improve.   Orders:  No orders of the defined types were placed in this encounter.  No orders of the defined types were placed in this encounter.     Procedures: Large Joint Inj Date/Time: 04/30/2017 11:09 AM Performed by: Leandrew Koyanagi Authorized by: Leandrew Koyanagi   Consent Given by:  Patient Timeout: prior to procedure the correct patient, procedure, and site was verified   Indications:  Pain Location:  Knee Site:  R knee Prep: patient was prepped and draped in usual sterile fashion   Needle Size:  22 G Approach:  Lateral Ultrasound Guidance: No   Fluoroscopic Guidance: No   Arthrogram: No   Medications:  2 mL bupivacaine 0.5 %; 40 mg methylPREDNISolone acetate 40 MG/ML; 2 mL lidocaine 1 % Patient tolerance:  Patient tolerated the procedure well with no immediate complications     Clinical Data: No additional findings.   Subjective: Chief Complaint  Patient presents with  . Right Knee - Pain    Patient follows up today for right knee pain. Previous cortisone injection gave him 3-4 months of relief. He still remains quite active. Denies any injuries.    Review of Systems   Objective: Vital Signs: There were no vitals taken for this visit.  Physical Exam  Ortho Exam Right knee  exam is stable. No joint effusion. Specialty Comments:  No specialty comments available.  Imaging: No results found.   PMFS History: Patient Active Problem List   Diagnosis Date Noted  . Unilateral primary osteoarthritis, right knee 07/25/2016  . HYPERLIPIDEMIA 04/12/2009   Past Medical History:  Diagnosis Date  . Allergy   . Bradycardia    " MY HEART RATE IS USUALLY AROUD 50 - I EXERCISE A LOT"  . Inguinal hernia    RIGHT    Family History  Problem Relation Age of Onset  . Heart attack Mother   . Pancreatic cancer Brother   . Colon cancer Neg Hx     Past Surgical History:  Procedure Laterality Date  . CLOSED REDUCTION ELBOW DISLOCATION     L ELBOW  . INGUINAL HERNIA REPAIR Right 08/13/2015   Procedure: LAPAROSCOPIC RIGHT INGUINAL HERNIA WITH MESH;  Surgeon: Coralie Keens, MD;  Location: [facility];  Service: General;  Laterality: Right;  . INSERTION OF MESH Right 08/13/2015   Procedure: INSERTION OF MESH;  Surgeon: Coralie Keens, MD;  Location: [facility];  Service: General;  Laterality: Right;  . KNEE ARTHROSCOPY  2002   RT KNEE  . TONSILLECTOMY AND ADENOIDECTOMY  1960   Social History   Occupational History  . Not on file.   Social History Main Topics  . Smoking status: Never Smoker  . Smokeless  tobacco: Current User    Types: Chew  . Alcohol use 1.8 oz/week    3 Shots of liquor per week     Comment: weekends  . Drug use: No  . Sexual activity: Not on file

## 2017-05-09 ENCOUNTER — Ambulatory Visit (INDEPENDENT_AMBULATORY_CARE_PROVIDER_SITE_OTHER): Payer: Medicare HMO | Admitting: *Deleted

## 2017-05-09 DIAGNOSIS — Z23 Encounter for immunization: Secondary | ICD-10-CM | POA: Diagnosis not present

## 2017-06-19 ENCOUNTER — Ambulatory Visit (INDEPENDENT_AMBULATORY_CARE_PROVIDER_SITE_OTHER): Payer: Medicare HMO | Admitting: Orthopaedic Surgery

## 2017-06-19 DIAGNOSIS — M1711 Unilateral primary osteoarthritis, right knee: Secondary | ICD-10-CM | POA: Diagnosis not present

## 2017-06-19 MED ORDER — HYALURONAN 88 MG/4ML IX SOSY
88.0000 mg | PREFILLED_SYRINGE | INTRA_ARTICULAR | Status: AC | PRN
  Administered 2017-06-19: 88 mg via INTRA_ARTICULAR

## 2017-06-19 NOTE — Progress Notes (Signed)
   Procedure Note  Patient: Hunter Collins             Date of Birth: 09/20/1945           MRN: [id number]             Visit Date: 06/19/2017  Procedures: Visit Diagnoses: Unilateral primary osteoarthritis, right knee  Large Joint Inj: R knee on 06/19/2017 11:39 AM Indications: pain Details: 22 G needle  Arthrogram: No  Medications: 88 mg Hyaluronan 88 MG/4ML Outcome: tolerated well, no immediate complications Patient was prepped and draped in the usual sterile fashion.

## 2017-06-19 NOTE — Addendum Note (Signed)
Addended by: Azucena Cecil on: 06/19/2017 11:40 AM   Modules accepted: Level of Service

## 2017-07-13 ENCOUNTER — Ambulatory Visit (INDEPENDENT_AMBULATORY_CARE_PROVIDER_SITE_OTHER): Payer: Medicare HMO | Admitting: Family Medicine

## 2017-07-13 ENCOUNTER — Encounter: Payer: Self-pay | Admitting: Family Medicine

## 2017-07-13 VITALS — BP 108/78 | HR 51 | Temp 97.9°F | Ht 69.0 in | Wt 172.6 lb

## 2017-07-13 DIAGNOSIS — Z0000 Encounter for general adult medical examination without abnormal findings: Secondary | ICD-10-CM

## 2017-07-13 DIAGNOSIS — Z23 Encounter for immunization: Secondary | ICD-10-CM

## 2017-07-13 LAB — HEPATIC FUNCTION PANEL
ALT: 16 U/L (ref 0–53)
AST: 20 U/L (ref 0–37)
Albumin: 4.2 g/dL (ref 3.5–5.2)
Alkaline Phosphatase: 54 U/L (ref 39–117)
BILIRUBIN DIRECT: 0.1 mg/dL (ref 0.0–0.3)
BILIRUBIN TOTAL: 0.7 mg/dL (ref 0.2–1.2)
Total Protein: 6.5 g/dL (ref 6.0–8.3)

## 2017-07-13 LAB — CBC WITH DIFFERENTIAL/PLATELET
BASOS ABS: 0 10*3/uL (ref 0.0–0.1)
Basophils Relative: 0.9 % (ref 0.0–3.0)
EOS ABS: 0 10*3/uL (ref 0.0–0.7)
Eosinophils Relative: 1.3 % (ref 0.0–5.0)
HEMATOCRIT: 43.6 % (ref 39.0–52.0)
Hemoglobin: 14.7 g/dL (ref 13.0–17.0)
LYMPHS ABS: 1.6 10*3/uL (ref 0.7–4.0)
LYMPHS PCT: 39.5 % (ref 12.0–46.0)
MCHC: 33.8 g/dL (ref 30.0–36.0)
MCV: 97.4 fl (ref 78.0–100.0)
Monocytes Absolute: 0.4 10*3/uL (ref 0.1–1.0)
Monocytes Relative: 11.4 % (ref 3.0–12.0)
NEUTROS ABS: 1.8 10*3/uL (ref 1.4–7.7)
NEUTROS PCT: 46.9 % (ref 43.0–77.0)
PLATELETS: 197 10*3/uL (ref 150.0–400.0)
RBC: 4.48 Mil/uL (ref 4.22–5.81)
RDW: 13.4 % (ref 11.5–15.5)
WBC: 3.9 10*3/uL — ABNORMAL LOW (ref 4.0–10.5)

## 2017-07-13 LAB — LIPID PANEL
Cholesterol: 225 mg/dL — ABNORMAL HIGH (ref 0–200)
HDL: 51.6 mg/dL (ref >39.00)
LDL CALC: 156 mg/dL — AB (ref 0–99)
NONHDL: 172.95
TRIGLYCERIDES: 84 mg/dL (ref 0.0–149.0)
Total CHOL/HDL Ratio: 4
VLDL: 16.8 mg/dL (ref 0.0–40.0)

## 2017-07-13 LAB — BASIC METABOLIC PANEL
BUN: 22 mg/dL (ref 6–23)
CHLORIDE: 104 meq/L (ref 96–112)
CO2: 29 mEq/L (ref 19–32)
CREATININE: 1.16 mg/dL (ref 0.40–1.50)
Calcium: 9.2 mg/dL (ref 8.4–10.5)
GFR: 65.45 mL/min (ref >60.00)
GLUCOSE: 85 mg/dL (ref 70–99)
Potassium: 4.5 mEq/L (ref 3.5–5.1)
Sodium: 138 mEq/L (ref 135–145)

## 2017-07-13 LAB — TSH: TSH: 2.58 u[IU]/mL (ref 0.35–4.50)

## 2017-07-13 NOTE — Progress Notes (Signed)
Subjective:     Patient ID: Hunter Collins, male   DOB: 21-Jun-1946, 71 y.o.   MRN: [id number]  HPI Patient seen for physical exam. He still working full-time with a security position and does some part-time work in addition to that. Works at least 40 hours per week.  Takes no prescription medications. He's had some recurrent problems with calluses on his feet and sees podiatry for trimming. He had previous Prevnar 13 but needs Pneumovax. No history of documented hepatitis C screening. Low risk. He has history of elevated lipids but has declined statin use the past. PSA was normal year ago.  He's had some knee problems with arthritis and sees orthopedics regarding that  Past Medical History:  Diagnosis Date  . Allergy   . Bradycardia    " MY HEART RATE IS USUALLY AROUD 50 - I EXERCISE A LOT"  . Inguinal hernia    RIGHT   Past Surgical History:  Procedure Laterality Date  . CLOSED REDUCTION ELBOW DISLOCATION     L ELBOW  . INGUINAL HERNIA REPAIR Right 08/13/2015   Procedure: LAPAROSCOPIC RIGHT INGUINAL HERNIA WITH MESH;  Surgeon: Coralie Keens, MD;  Location: [facility];  Service: General;  Laterality: Right;  . INSERTION OF MESH Right 08/13/2015   Procedure: INSERTION OF MESH;  Surgeon: Coralie Keens, MD;  Location: [facility];  Service: General;  Laterality: Right;  . KNEE ARTHROSCOPY  2002   RT KNEE  . TONSILLECTOMY AND ADENOIDECTOMY  1960    reports that  has never smoked. His smokeless tobacco use includes chew. He reports that he drinks about 1.8 oz of alcohol per week. He reports that he does not use drugs. family history includes Heart attack in his mother; Pancreatic cancer in his brother. No Known Allergies   Review of Systems  Constitutional: Negative for activity change, appetite change, fatigue and fever.  HENT: Negative for congestion, ear pain and trouble swallowing.   Eyes: Negative for pain and visual disturbance.  Respiratory: Negative for cough, shortness of breath and  wheezing.   Cardiovascular: Negative for chest pain and palpitations.  Gastrointestinal: Negative for abdominal distention, abdominal pain, blood in stool, constipation, diarrhea, nausea, rectal pain and vomiting.  Genitourinary: Negative for dysuria, hematuria and testicular pain.  Musculoskeletal: Positive for arthralgias. Negative for joint swelling.  Skin: Negative for rash.  Neurological: Negative for dizziness, syncope and headaches.  Hematological: Negative for adenopathy.  Psychiatric/Behavioral: Negative for confusion and dysphoric mood.       Objective:   Physical Exam  Constitutional: He is oriented to person, place, and time. He appears well-developed and well-nourished. No distress.  HENT:  Head: Normocephalic and atraumatic.  Right Ear: External ear normal.  Left Ear: External ear normal.  Mouth/Throat: Oropharynx is clear and moist.  Eyes: Conjunctivae and EOM are normal. Pupils are equal, round, and reactive to light.  Neck: Normal range of motion. Neck supple. No thyromegaly present.  Cardiovascular: Normal rate, regular rhythm and normal heart sounds.  No murmur heard. Pulmonary/Chest: No respiratory distress. He has no wheezes. He has no rales.  Abdominal: Soft. Bowel sounds are normal. He exhibits no distension and no mass. There is no tenderness. There is no rebound and no guarding.  Musculoskeletal: He exhibits no edema.  Lymphadenopathy:    He has no cervical adenopathy.  Neurological: He is alert and oriented to person, place, and time. He displays normal reflexes. No cranial nerve deficit.  Skin: No rash noted.  Psychiatric: He has a  normal mood and affect.       Assessment:     Physical exam. Following issues were addressed    Plan:     -flu vaccine already given -Pneumovax given -Obtain screening labs -The natural history of prostate cancer and ongoing controversy regarding screening and potential treatment outcomes of prostate cancer has been  discussed with the patient. The meaning of a false positive PSA and a false negative PSA has been discussed. He indicates understanding of the limitations of this screening test and wishes not to proceed with screening PSA testing. -check Hep C antibody -continue regular exercise habits.  [name] MD [facility]

## 2017-07-14 LAB — HEPATITIS C ANTIBODY
HEP C AB: NONREACTIVE
SIGNAL TO CUT-OFF: 0.01 (ref <1.00)

## 2017-07-27 ENCOUNTER — Ambulatory Visit: Payer: Medicare HMO | Admitting: Podiatry

## 2017-07-27 ENCOUNTER — Encounter: Payer: Self-pay | Admitting: Podiatry

## 2017-07-27 DIAGNOSIS — Q828 Other specified congenital malformations of skin: Secondary | ICD-10-CM | POA: Diagnosis not present

## 2017-07-27 DIAGNOSIS — M129 Arthropathy, unspecified: Secondary | ICD-10-CM

## 2017-07-27 DIAGNOSIS — M2041 Other hammer toe(s) (acquired), right foot: Secondary | ICD-10-CM

## 2017-07-27 NOTE — Progress Notes (Signed)
Subjective:     Patient ID: Hunter Collins, male   DOB: 06-17-1946, 72 y.o.   MRN: [id number]  HPI this patient presents to the office stating that he has developed a callus on the arch left foot and painful corn fifth toe right foot.  These area are painful walking and wearing his shoes.  He desires evaluation and treatment.   Review of Systems     Objective:   Physical Exam GENERAL APPEARANCE: Alert, conversant. Appropriately groomed. No acute distress.  VASCULAR: Pedal pulses are  palpable at  [facility] and PT bilateral.  Capillary refill time is immediate to all digits,  Normal temperature gradient.  Digital hair growth is present bilateral  NEUROLOGIC: sensation is normal to 5.07 monofilament at 5/5 sites bilateral.  Light touch is intact bilateral, Muscle strength normal.  MUSCULOSKELETAL: acceptable muscle strength, tone and stability bilateral.  Intrinsic muscluature intact bilateral.  Rectus appearance of foot and digits noted bilateral. End stage PTTD left foot. Hammer toe 5th right foot.  DERMATOLOGIC: skin color, texture, and turgor are within normal limits.  No preulcerative lesions or ulcers  are seen, no interdigital maceration noted.  No open lesions present.  Digital nails are asymptomatic. No drainage noted. Porokeratosis 2,3 right foot. Hammer toes 2-4 B/L.  Hammer toe 5th right.      Assessment:  Porokeratosis right forefoot.  [place] durum 5th right.    Plan:     ROV  Debride porokeratosis  RTC prn   Gardiner Barefoot DPM

## 2017-08-20 DIAGNOSIS — D1801 Hemangioma of skin and subcutaneous tissue: Secondary | ICD-10-CM | POA: Diagnosis not present

## 2017-08-20 DIAGNOSIS — B351 Tinea unguium: Secondary | ICD-10-CM | POA: Diagnosis not present

## 2017-08-20 DIAGNOSIS — L570 Actinic keratosis: Secondary | ICD-10-CM | POA: Diagnosis not present

## 2017-08-20 DIAGNOSIS — L821 Other seborrheic keratosis: Secondary | ICD-10-CM | POA: Diagnosis not present

## 2017-08-20 DIAGNOSIS — Z85828 Personal history of other malignant neoplasm of skin: Secondary | ICD-10-CM | POA: Diagnosis not present

## 2017-08-29 ENCOUNTER — Encounter: Payer: Self-pay | Admitting: Family Medicine

## 2017-08-29 ENCOUNTER — Ambulatory Visit (INDEPENDENT_AMBULATORY_CARE_PROVIDER_SITE_OTHER): Payer: Medicare HMO | Admitting: Family Medicine

## 2017-08-29 VITALS — BP 110/74 | HR 79 | Temp 98.6°F | Wt 170.2 lb

## 2017-08-29 DIAGNOSIS — R0981 Nasal congestion: Secondary | ICD-10-CM | POA: Diagnosis not present

## 2017-08-29 DIAGNOSIS — R52 Pain, unspecified: Secondary | ICD-10-CM | POA: Diagnosis not present

## 2017-08-29 LAB — POCT INFLUENZA A/B
INFLUENZA A, POC: NEGATIVE
INFLUENZA B, POC: NEGATIVE

## 2017-08-29 NOTE — Patient Instructions (Signed)
Upper Respiratory Infection, Adult Most upper respiratory infections (URIs) are a viral infection of the air passages leading to the lungs. A URI affects the nose, throat, and upper air passages. The most common type of URI is nasopharyngitis and is typically referred to as "the common cold." URIs run their course and usually go away on their own. Most of the time, a URI does not require medical attention, but sometimes a bacterial infection in the upper airways can follow a viral infection. This is called a secondary infection. Sinus and middle ear infections are common types of secondary upper respiratory infections. Bacterial pneumonia can also complicate a URI. A URI can worsen asthma and chronic obstructive pulmonary disease (COPD). Sometimes, these complications can require emergency medical care and may be life threatening. What are the causes? Almost all URIs are caused by viruses. A virus is a type of germ and can spread from one person to another. What increases the risk? You may be at risk for a URI if:  You smoke.  You have chronic heart or lung disease.  You have a weakened defense (immune) system.  You are very young or very old.  You have nasal allergies or asthma.  You work in crowded or poorly ventilated areas.  You work in health care facilities or schools.  What are the signs or symptoms? Symptoms typically develop 2-3 days after you come in contact with a cold virus. Most viral URIs last 7-10 days. However, viral URIs from the influenza virus (flu virus) can last 14-18 days and are typically more severe. Symptoms may include:  Runny or stuffy (congested) nose.  Sneezing.  Cough.  Sore throat.  Headache.  Fatigue.  Fever.  Loss of appetite.  Pain in your forehead, behind your eyes, and over your cheekbones (sinus pain).  Muscle aches.  How is this diagnosed? Your health care provider may diagnose a URI by:  Physical exam.  Tests to check that your  symptoms are not due to another condition such as: ? Strep throat. ? Sinusitis. ? Pneumonia. ? Asthma.  How is this treated? A URI goes away on its own with time. It cannot be cured with medicines, but medicines may be prescribed or recommended to relieve symptoms. Medicines may help:  Reduce your fever.  Reduce your cough.  Relieve nasal congestion.  Follow these instructions at home:  Take medicines only as directed by your health care provider.  Gargle warm saltwater or take cough drops to comfort your throat as directed by your health care provider.  Use a warm mist humidifier or inhale steam from a shower to increase air moisture. This may make it easier to breathe.  Drink enough fluid to keep your urine clear or pale yellow.  Eat soups and other clear broths and maintain good nutrition.  Rest as needed.  Return to work when your temperature has returned to normal or as your health care provider advises. You may need to stay home longer to avoid infecting others. You can also use a face mask and careful hand washing to prevent spread of the virus.  Increase the usage of your inhaler if you have asthma.  Do not use any tobacco products, including cigarettes, chewing tobacco, or electronic cigarettes. If you need help quitting, ask your health care provider. How is this prevented? The best way to protect yourself from getting a cold is to practice good hygiene.  Avoid oral or hand contact with people with cold symptoms.  Wash your   hands often if contact occurs.  There is no clear evidence that vitamin C, vitamin E, echinacea, or exercise reduces the chance of developing a cold. However, it is always recommended to get plenty of rest, exercise, and practice good nutrition. Contact a health care provider if:  You are getting worse rather than better.  Your symptoms are not controlled by medicine.  You have chills.  You have worsening shortness of breath.  You have  brown or red mucus.  You have yellow or brown nasal discharge.  You have pain in your face, especially when you bend forward.  You have a fever.  You have swollen neck glands.  You have pain while swallowing.  You have white areas in the back of your throat. Get help right away if:  You have severe or persistent: ? Headache. ? Ear pain. ? Sinus pain. ? Chest pain.  You have chronic lung disease and any of the following: ? Wheezing. ? Prolonged cough. ? Coughing up blood. ? A change in your usual mucus.  You have a stiff neck.  You have changes in your: ? Vision. ? Hearing. ? Thinking. ? Mood. This information is not intended to replace advice given to you by your health care provider. Make sure you discuss any questions you have with your health care provider. Document Released: 01/03/2001 Document Revised: 03/12/2016 Document Reviewed: 10/15/2013 Elsevier Interactive Patient Education  2018 [employer]  

## 2017-08-29 NOTE — Progress Notes (Signed)
Subjective:     Patient ID: Hunter Collins, male   DOB: [birth year], 71 y.o.   MRN: [id number]  HPI Patient seen with sudden onset yesterday of some nasal congestion, sore throat, body aches. His wife had similar symptoms a week ago. She tested negative for flu. He is not aware of any fever but has been taking, all every 4 hours. He had some leftover ipratropium nasal spray which has helped his nasal congestion. No nausea or vomiting. No diarrhea. No dyspnea. Denies any cough at this time  Past Medical History:  Diagnosis Date  . Allergy   . Bradycardia    " MY HEART RATE IS USUALLY AROUD 50 - I EXERCISE A LOT"  . Inguinal hernia    RIGHT   Past Surgical History:  Procedure Laterality Date  . CLOSED REDUCTION ELBOW DISLOCATION     L ELBOW  . INGUINAL HERNIA REPAIR Right 08/13/2015   Procedure: LAPAROSCOPIC RIGHT INGUINAL HERNIA WITH MESH;  Surgeon: Coralie Keens, MD;  Location: [facility];  Service: General;  Laterality: Right;  . INSERTION OF MESH Right 08/13/2015   Procedure: INSERTION OF MESH;  Surgeon: Coralie Keens, MD;  Location: [facility];  Service: General;  Laterality: Right;  . KNEE ARTHROSCOPY  2002   RT KNEE  . TONSILLECTOMY AND ADENOIDECTOMY  1960    reports that  has never smoked. His smokeless tobacco use includes chew. He reports that he drinks about 1.8 oz of alcohol per week. He reports that he does not use drugs. family history includes Heart attack in his mother; Pancreatic cancer in his brother. No Known Allergies   Review of Systems  Constitutional: Positive for fatigue. Negative for chills and fever.  HENT: Positive for congestion and sore throat.   Respiratory: Negative for cough.   Gastrointestinal: Negative for abdominal pain, nausea and vomiting.  Musculoskeletal: Positive for myalgias.       Objective:   Physical Exam  Constitutional: He appears well-developed and well-nourished.  HENT:  Right Ear: External ear normal.  Left Ear: External ear  normal.  Mouth/Throat: Oropharynx is clear and moist.  Neck: Neck supple.  Cardiovascular: Normal rate and regular rhythm.  Pulmonary/Chest: Effort normal and breath sounds normal. No respiratory distress. He has no wheezes. He has no rales.  Lymphadenopathy:    He has no cervical adenopathy.       Assessment:     Acute viral syndrome. Influenza screen negative. Patient nontoxic in appearance    Plan:     -Push fluids -Caution not to take more than 1000 mg of Tylenol every 6 hours -Cautioned about anticholinergic effects with ipratropium nasal -Follow-up promptly for any fever or worsening symptoms  [name] MD [facility]

## 2017-09-06 ENCOUNTER — Telehealth: Payer: Self-pay | Admitting: Family Medicine

## 2017-09-06 NOTE — Telephone Encounter (Signed)
Provider is out of office today, will route this message for review.

## 2017-09-06 NOTE — Telephone Encounter (Signed)
Copied from [place] [id number]. Topic: Quick Communication - See Telephone Encounter >> Sep 06, 2017  3:00 PM Ether Griffins B wrote: CRM for notification. See Telephone encounter for:  Pt last seen on 08/29/17 and he is over the cold but his sinuses are bothering him. He is having green phlegm and is wondering if an antibiotic could be called in for him. Please send to [place], [place] 09/06/17.

## 2017-09-07 MED ORDER — AMOXICILLIN-POT CLAVULANATE 875-125 MG PO TABS: 1.0000 | ORAL_TABLET | Freq: Two times a day (BID) | ORAL | 0 refills | Status: DC

## 2017-09-07 NOTE — Telephone Encounter (Signed)
If no allergies, start Augmentin 875 mg twice daily for 10 days and take with food

## 2017-09-07 NOTE — Telephone Encounter (Signed)
Spoke with patient and Rx sent °

## 2017-10-26 ENCOUNTER — Ambulatory Visit (INDEPENDENT_AMBULATORY_CARE_PROVIDER_SITE_OTHER): Payer: Medicare HMO | Admitting: Podiatry

## 2017-10-26 ENCOUNTER — Encounter: Payer: Self-pay | Admitting: Podiatry

## 2017-10-26 DIAGNOSIS — Q828 Other specified congenital malformations of skin: Secondary | ICD-10-CM | POA: Diagnosis not present

## 2017-10-26 DIAGNOSIS — M2041 Other hammer toe(s) (acquired), right foot: Secondary | ICD-10-CM

## 2017-10-26 NOTE — Progress Notes (Signed)
This patient presents the office with chief complaint of painful calluses on both feet.  He has a painful callus through the arch of the left foot and a painful corn, fifth toe right foot.  He also has pain through the right forefoot.  He is very active in these areas .  He presents the office today for an evaluation and treatment of his painful callus   General Appearance  Alert, conversant and in no acute stress.  Vascular  Dorsalis pedis and posterior tibial  pulses are palpable  bilaterally.  Capillary return is within normal limits  bilaterally. Temperature is within normal limits  bilaterally.  Neurologic  Senn-Weinstein monofilament wire test within normal limits  bilaterally. Muscle power within normal limits bilaterally.  Nails Thick disfigured discolored nails with subungual debris  from hallux to fifth toes bilaterally. No evidence of bacterial infection or drainage bilaterally.  Orthopedic  No limitations of motion of motion feet .  No crepitus or effusions noted.  End stage PTTD left foot.  Hammer toe fifth toe right foot.  Skin  normotropic skin with no porokeratosis noted bilaterally.  No signs of infections or ulcers noted.  Heloma durum fifth toe right foot.  Diffuse plantar tyloma right foot.  Porokeratosis left foot.  Heloma durum fifth toe right foot.  ROV.  Debride porokeratosis /corn fifth toe right foot.  Debridement of callus  B/L.  RTC prn   Rosemaria Inabinet DPM 

## 2017-10-31 IMAGING — DX DG KNEE COMPLETE 4+V*R*
4 series · 4 of 4 positions shown · non-contrast
Comparison: No prior.

CLINICAL DATA: Chronic knee pain.  No injury.  Initial evaluation .

EXAM:
RIGHT KNEE - COMPLETE 4+ VIEW

[knee ap]
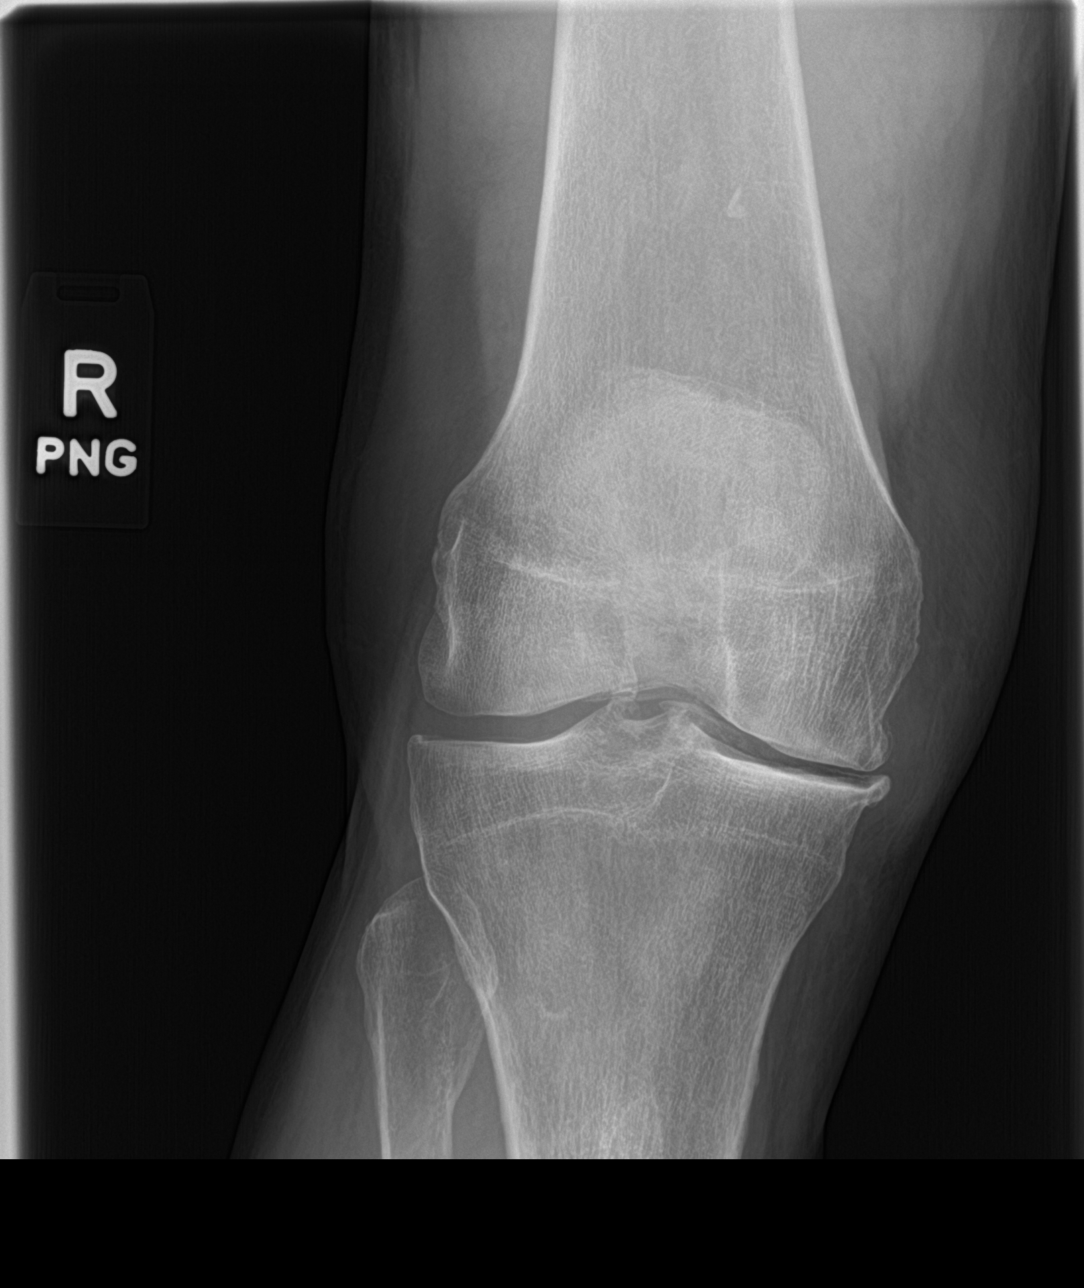

[knee tunnel]
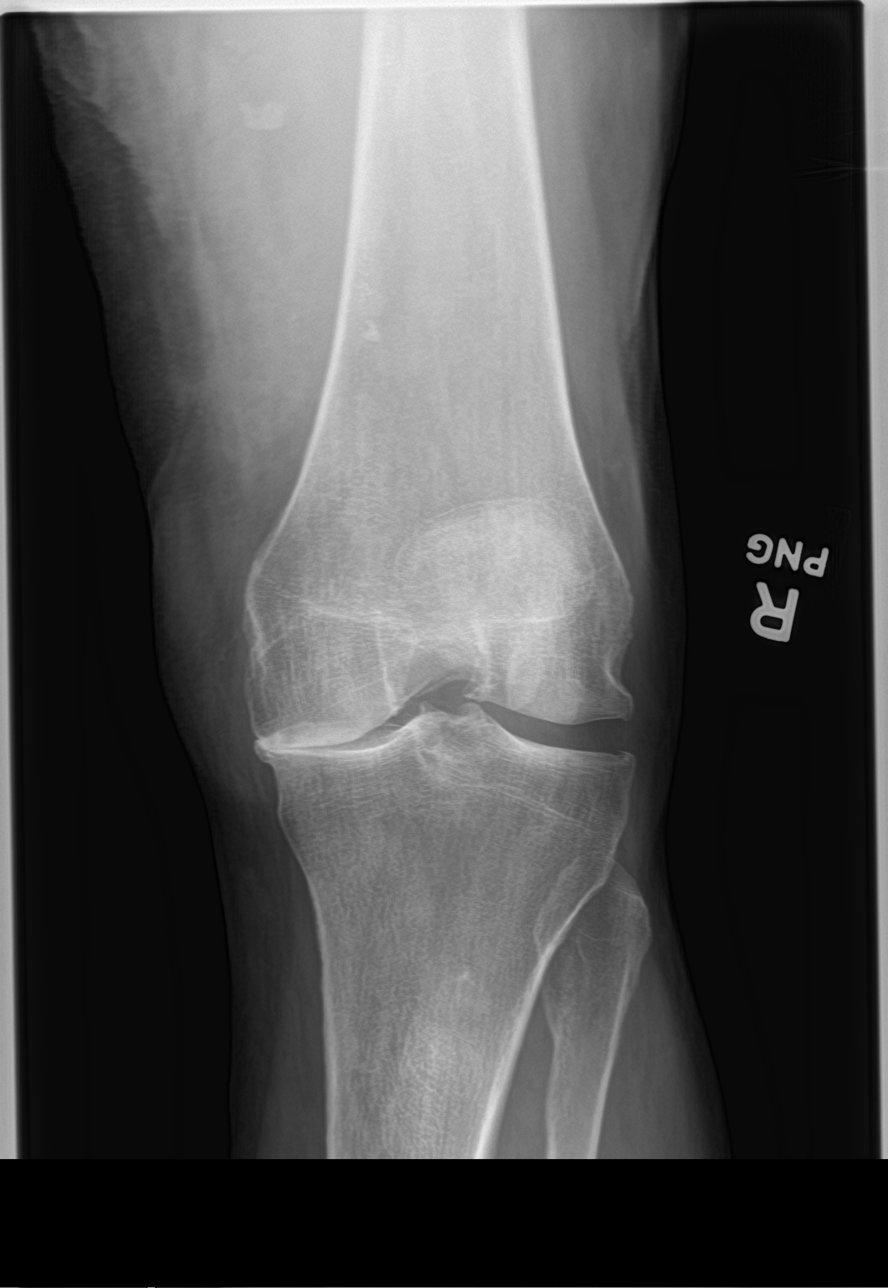

[knee lat]
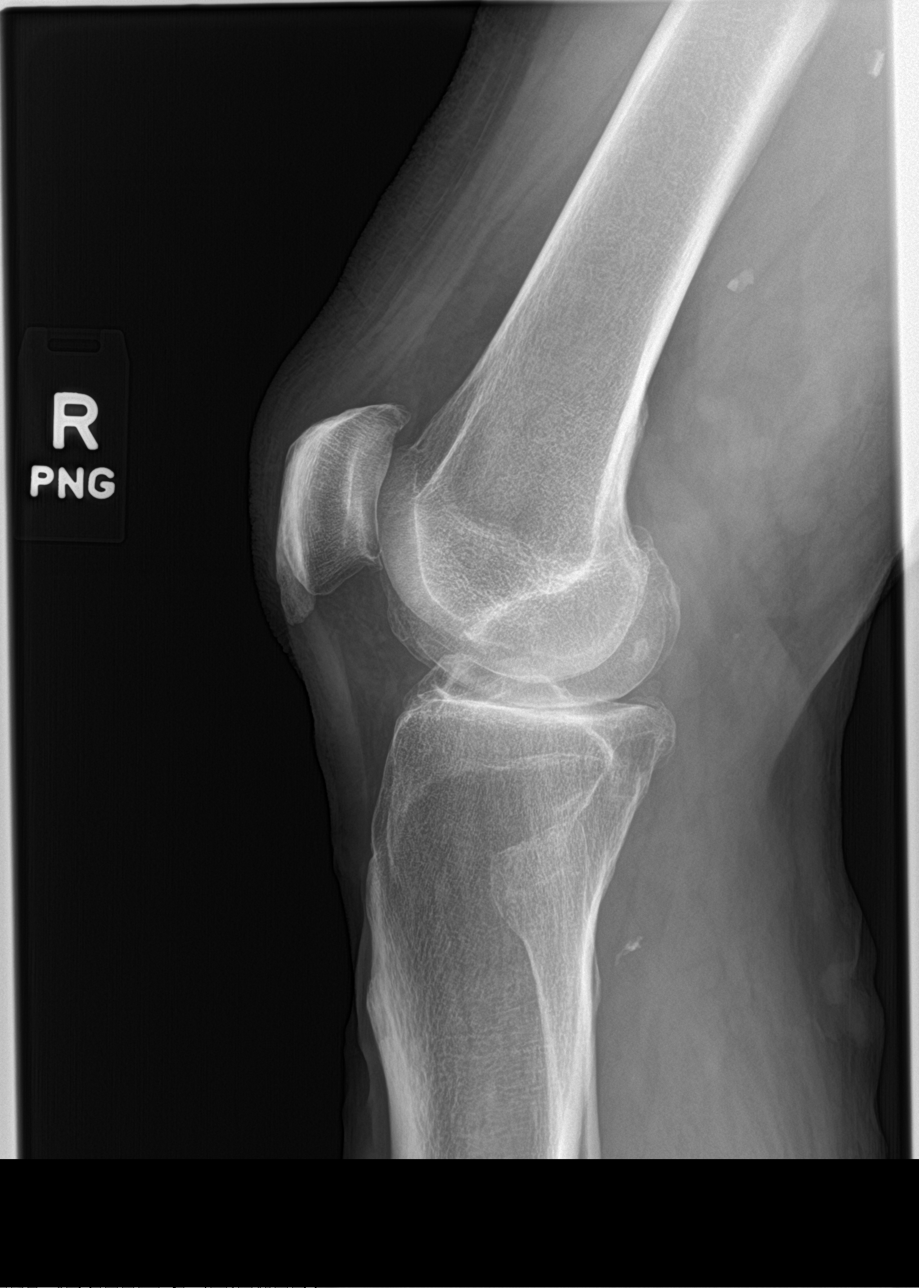

[sunrise]
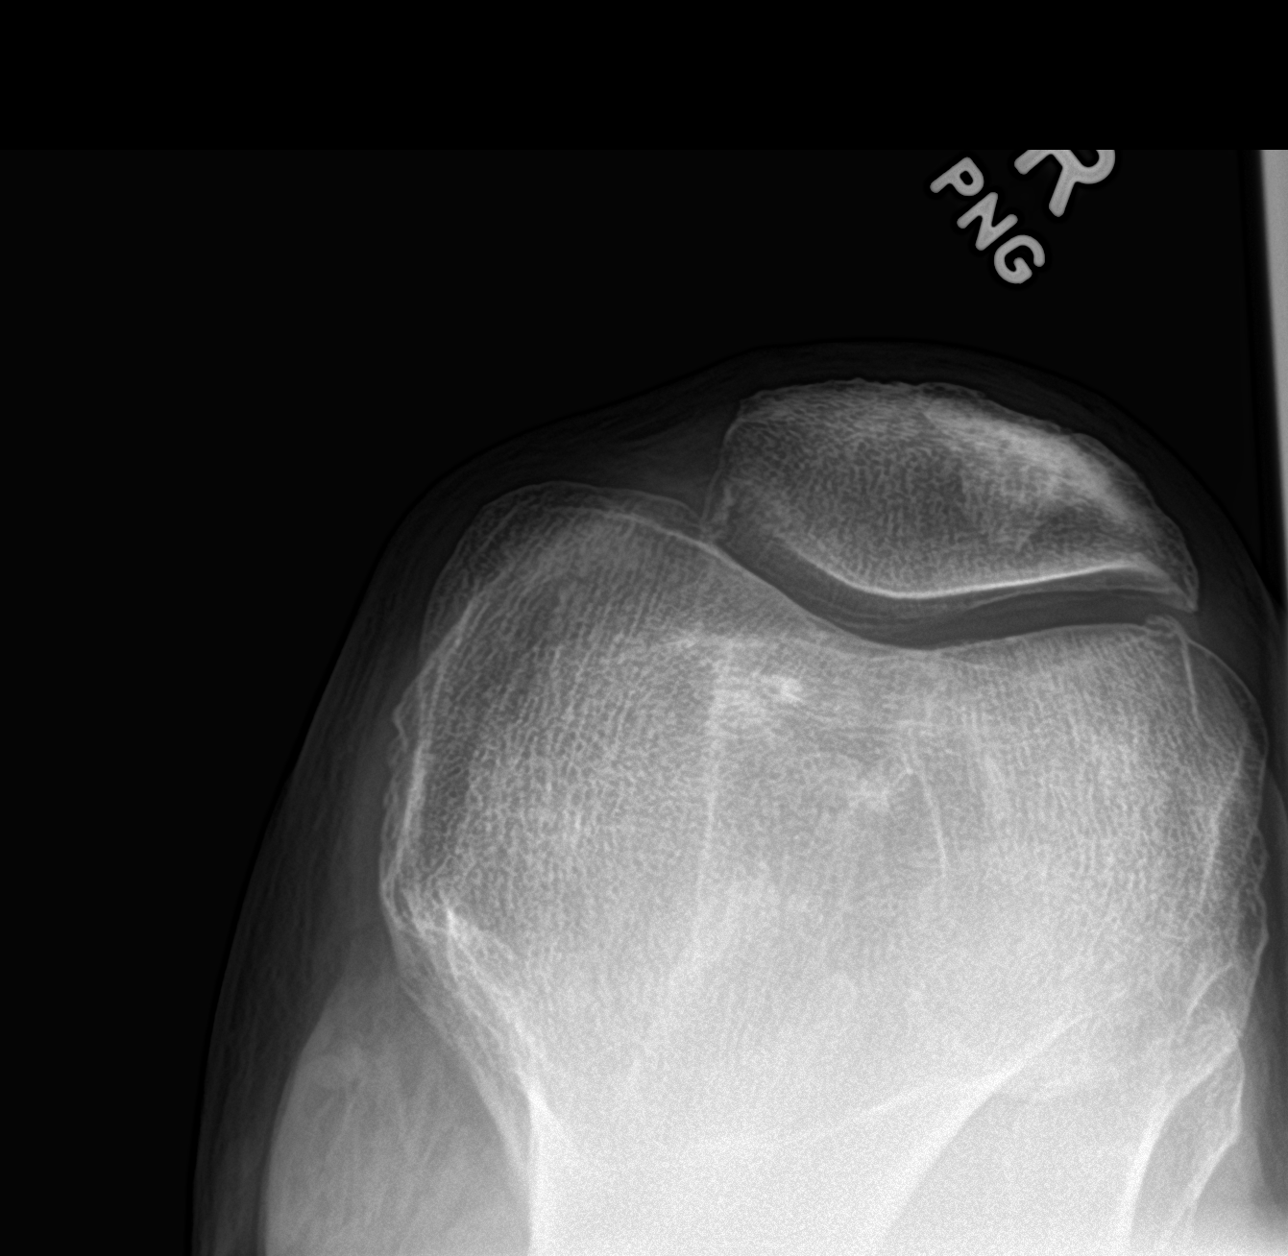

[4 of 4 positions shown; findings below may reference images not displayed]

FINDINGS: Diffuse degenerative change right knee. No acute bony or joint
abnormality. No evidence of fracture or dislocation. Benign soft
tissue calcifications noted.
IMPRESSION: Tricompartment degenerative change. No acute abnormality identified.

## 2017-12-18 ENCOUNTER — Ambulatory Visit (INDEPENDENT_AMBULATORY_CARE_PROVIDER_SITE_OTHER): Payer: Medicare HMO | Admitting: Orthopaedic Surgery

## 2018-02-22 ENCOUNTER — Ambulatory Visit (INDEPENDENT_AMBULATORY_CARE_PROVIDER_SITE_OTHER): Payer: Medicare HMO | Admitting: Podiatry

## 2018-02-22 ENCOUNTER — Encounter: Payer: Self-pay | Admitting: Podiatry

## 2018-02-22 DIAGNOSIS — Q828 Other specified congenital malformations of skin: Secondary | ICD-10-CM | POA: Diagnosis not present

## 2018-02-22 DIAGNOSIS — M2041 Other hammer toe(s) (acquired), right foot: Secondary | ICD-10-CM | POA: Diagnosis not present

## 2018-02-22 NOTE — Progress Notes (Signed)
This patient presents the office with chief complaint of painful calluses on both feet.  He has a painful callus through the arch of the left foot and a painful corn, fifth toe right foot.  He also has pain through the right forefoot.  He is very active in these areas .  He presents the office today for an evaluation and treatment of his painful callus   General Appearance  Alert, conversant and in no acute stress.  Vascular  Dorsalis pedis and posterior tibial  pulses are palpable  bilaterally.  Capillary return is within normal limits  bilaterally. Temperature is within normal limits  bilaterally.  Neurologic  Senn-Weinstein monofilament wire test within normal limits  bilaterally. Muscle power within normal limits bilaterally.  Nails Thick disfigured discolored nails with subungual debris  from hallux to fifth toes bilaterally. No evidence of bacterial infection or drainage bilaterally.  Orthopedic  No limitations of motion of motion feet .  No crepitus or effusions noted.  End stage PTTD left foot.  Hammer toe fifth toe right foot.  Skin  normotropic skin with no porokeratosis noted bilaterally.  No signs of infections or ulcers noted.  Heloma durum fifth toe right foot.  Diffuse plantar tyloma right foot.  Porokeratosis left foot.  Heloma durum fifth toe right foot.  ROV.  Debride porokeratosis /corn fifth toe right foot.  Debridement of callus  B/L.  RTC prn   Hunter Collins DPM 

## 2018-03-18 DIAGNOSIS — D225 Melanocytic nevi of trunk: Secondary | ICD-10-CM | POA: Diagnosis not present

## 2018-03-18 DIAGNOSIS — D1801 Hemangioma of skin and subcutaneous tissue: Secondary | ICD-10-CM | POA: Diagnosis not present

## 2018-03-18 DIAGNOSIS — I8393 Asymptomatic varicose veins of bilateral lower extremities: Secondary | ICD-10-CM | POA: Diagnosis not present

## 2018-03-18 DIAGNOSIS — Z85828 Personal history of other malignant neoplasm of skin: Secondary | ICD-10-CM | POA: Diagnosis not present

## 2018-05-21 ENCOUNTER — Ambulatory Visit: Payer: Medicare HMO

## 2018-05-21 ENCOUNTER — Ambulatory Visit (INDEPENDENT_AMBULATORY_CARE_PROVIDER_SITE_OTHER): Payer: Medicare HMO

## 2018-05-21 DIAGNOSIS — Z23 Encounter for immunization: Secondary | ICD-10-CM

## 2018-05-24 ENCOUNTER — Encounter: Payer: Self-pay | Admitting: Podiatry

## 2018-05-24 ENCOUNTER — Ambulatory Visit: Payer: Medicare HMO | Admitting: Podiatry

## 2018-05-24 DIAGNOSIS — M2041 Other hammer toe(s) (acquired), right foot: Secondary | ICD-10-CM | POA: Diagnosis not present

## 2018-05-24 DIAGNOSIS — Q828 Other specified congenital malformations of skin: Secondary | ICD-10-CM

## 2018-05-24 NOTE — Progress Notes (Signed)
This patient presents the office with chief complaint of painful calluses on both feet.  He has a painful callus through the arch of the left foot and a painful corn, fifth toe right foot.  He also has pain through the right forefoot.  He is very active in these areas .  He presents the office today for an evaluation and treatment of his painful callus   General Appearance  Alert, conversant and in no acute stress.  Vascular  Dorsalis pedis and posterior tibial  pulses are palpable  bilaterally.  Capillary return is within normal limits  bilaterally. Temperature is within normal limits  bilaterally.  Neurologic  Senn-Weinstein monofilament wire test within normal limits  bilaterally. Muscle power within normal limits bilaterally.  Nails Thick disfigured discolored nails with subungual debris  from hallux to fifth toes bilaterally. No evidence of bacterial infection or drainage bilaterally.  Orthopedic  No limitations of motion of motion feet .  No crepitus or effusions noted.  End stage PTTD left foot.  Hammer toe fifth toe right foot.  Skin  normotropic skin with no porokeratosis noted bilaterally.  No signs of infections or ulcers noted.  Heloma durum fifth toe right foot.  Diffuse plantar tyloma right foot.  Porokeratosis left foot.  Heloma durum fifth toe right foot.  ROV.  Debride porokeratosis /corn fifth toe right foot.  Debridement of callus  B/L.  RTC prn   Courtland Coppa DPM 

## 2018-07-15 ENCOUNTER — Other Ambulatory Visit: Payer: Self-pay

## 2018-07-15 ENCOUNTER — Encounter: Payer: Self-pay | Admitting: Family Medicine

## 2018-07-15 ENCOUNTER — Ambulatory Visit (INDEPENDENT_AMBULATORY_CARE_PROVIDER_SITE_OTHER): Payer: Medicare HMO | Admitting: Family Medicine

## 2018-07-15 VITALS — BP 100/68 | HR 59 | Temp 98.1°F | Ht 66.0 in | Wt 175.8 lb

## 2018-07-15 DIAGNOSIS — Z0000 Encounter for general adult medical examination without abnormal findings: Secondary | ICD-10-CM

## 2018-07-15 DIAGNOSIS — Z23 Encounter for immunization: Secondary | ICD-10-CM | POA: Diagnosis not present

## 2018-07-15 NOTE — Progress Notes (Signed)
Subjective:     Patient ID: Hunter Collins, male   DOB: Dec 12, 1945, 72 y.o.   MRN: [id number]  HPI Patient is seen for physical exam.  Generally very healthy.  Takes no regular medications.  Walks about hour and half a day.  He has been an avid runner for several years.  No longer runs.  Has had flu vaccine.  Needs tetanus.  Had previous shingles vaccine.  Has had both pneumonia vaccines.  Colonoscopy up-to-date.  Previous hepatitis C screening negative.  Does have history of high lipids but otherwise low risk for CAD.  He is declined consideration for statins in the past.  Past Medical History:  Diagnosis Date  . Allergy   . Bradycardia    " MY HEART RATE IS USUALLY AROUD 50 - I EXERCISE A LOT"  . Inguinal hernia    RIGHT   Past Surgical History:  Procedure Laterality Date  . CLOSED REDUCTION ELBOW DISLOCATION     L ELBOW  . INGUINAL HERNIA REPAIR Right 08/13/2015   Procedure: LAPAROSCOPIC RIGHT INGUINAL HERNIA WITH MESH;  Surgeon: Coralie Keens, MD;  Location: [facility];  Service: General;  Laterality: Right;  . INSERTION OF MESH Right 08/13/2015   Procedure: INSERTION OF MESH;  Surgeon: Coralie Keens, MD;  Location: [facility];  Service: General;  Laterality: Right;  . KNEE ARTHROSCOPY  2002   RT KNEE  . TONSILLECTOMY AND ADENOIDECTOMY  1960    reports that he has never smoked. His smokeless tobacco use includes chew. He reports current alcohol use of about 3.0 standard drinks of alcohol per week. He reports that he does not use drugs. family history includes Heart attack in his mother; Pancreatic cancer in his brother. No Known Allergies   Review of Systems  Constitutional: Negative for activity change, appetite change, fatigue and fever.  HENT: Negative for congestion, ear pain and trouble swallowing.   Eyes: Negative for pain and visual disturbance.  Respiratory: Negative for cough, shortness of breath and wheezing.   Cardiovascular: Negative for chest pain and palpitations.   Gastrointestinal: Negative for abdominal distention, abdominal pain, blood in stool, constipation, diarrhea, nausea, rectal pain and vomiting.  Genitourinary: Negative for dysuria, hematuria and testicular pain.  Musculoskeletal: Negative for arthralgias and joint swelling.  Skin: Negative for rash.  Neurological: Negative for dizziness, syncope and headaches.  Hematological: Negative for adenopathy.  Psychiatric/Behavioral: Negative for confusion and dysphoric mood.       Objective:   Physical Exam Constitutional:      General: He is not in acute distress.    Appearance: He is well-developed.  HENT:     Head: Normocephalic and atraumatic.     Right Ear: External ear normal.     Left Ear: External ear normal.  Eyes:     Conjunctiva/sclera: Conjunctivae normal.     Pupils: Pupils are equal, round, and reactive to light.  Neck:     Musculoskeletal: Normal range of motion and neck supple.     Thyroid: No thyromegaly.  Cardiovascular:     Rate and Rhythm: Normal rate and regular rhythm.     Heart sounds: Normal heart sounds. No murmur.  Pulmonary:     Effort: No respiratory distress.     Breath sounds: No wheezing or rales.  Abdominal:     General: Bowel sounds are normal. There is no distension.     Palpations: Abdomen is soft. There is no mass.     Tenderness: There is no abdominal tenderness. There  is no guarding or rebound.  Lymphadenopathy:     Cervical: No cervical adenopathy.  Skin:    Findings: No rash.  Neurological:     Mental Status: He is alert and oriented to person, place, and time.     Cranial Nerves: No cranial nerve deficit.     Deep Tendon Reflexes: Reflexes normal.        Assessment:     Physical exam.  Healthy 73 year old male the following issues were addressed    Plan:     -Flu vaccine already given -Tetanus booster given today -Discussed newer shingles vaccine and he will check on insurance coverage -Discussed pros and cons of lab work and  at this point he declined and we will consider by next year -Continue regular exercise habits  [name] MD [facility] at [facility]

## 2018-07-15 NOTE — Patient Instructions (Signed)
Consider Shingrix vaccine and check with pharmacy for insurance coverage.

## 2018-08-23 ENCOUNTER — Encounter: Payer: Self-pay | Admitting: Podiatry

## 2018-08-23 ENCOUNTER — Ambulatory Visit: Payer: Medicare HMO | Admitting: Podiatry

## 2018-08-23 DIAGNOSIS — Q828 Other specified congenital malformations of skin: Secondary | ICD-10-CM | POA: Diagnosis not present

## 2018-08-23 DIAGNOSIS — M2041 Other hammer toe(s) (acquired), right foot: Secondary | ICD-10-CM

## 2018-08-23 NOTE — Progress Notes (Signed)
This patient presents the office with chief complaint of painful calluses on both feet.  He has a painful callus through the arch of the left foot and a painful corn, fifth toe right foot.  He also has pain through the right forefoot.  He is very active in these areas .  He presents the office today for an evaluation and treatment of his painful callus   General Appearance  Alert, conversant and in no acute stress.  Vascular  Dorsalis pedis and posterior tibial  pulses are palpable  bilaterally.  Capillary return is within normal limits  bilaterally. Temperature is within normal limits  bilaterally.  Neurologic  Senn-Weinstein monofilament wire test within normal limits  bilaterally. Muscle power within normal limits bilaterally.  Nails Thick disfigured discolored nails with subungual debris  from hallux to fifth toes bilaterally. No evidence of bacterial infection or drainage bilaterally.  Orthopedic  No limitations of motion of motion feet .  No crepitus or effusions noted.  End stage PTTD left foot.  Hammer toe fifth toe right foot.  Skin  normotropic skin with no porokeratosis noted bilaterally.  No signs of infections or ulcers noted.  Heloma durum fifth toe right foot.  Diffuse plantar tyloma right foot.  Porokeratosis left foot.  Heloma durum fifth toe right foot.  ROV.  Debride porokeratosis /corn fifth toe right foot.  Debridement of callus  B/L.  RTC prn   Gardiner Barefoot DPM

## 2018-09-17 ENCOUNTER — Encounter: Payer: Self-pay | Admitting: Family Medicine

## 2018-09-17 ENCOUNTER — Other Ambulatory Visit: Payer: Self-pay

## 2018-09-17 ENCOUNTER — Ambulatory Visit (INDEPENDENT_AMBULATORY_CARE_PROVIDER_SITE_OTHER): Payer: Medicare HMO | Admitting: Family Medicine

## 2018-09-17 VITALS — BP 108/68 | HR 56 | Temp 97.6°F | Ht 66.0 in | Wt 180.9 lb

## 2018-09-17 DIAGNOSIS — L820 Inflamed seborrheic keratosis: Secondary | ICD-10-CM | POA: Diagnosis not present

## 2018-09-17 DIAGNOSIS — L821 Other seborrheic keratosis: Secondary | ICD-10-CM | POA: Diagnosis not present

## 2018-09-17 DIAGNOSIS — D225 Melanocytic nevi of trunk: Secondary | ICD-10-CM | POA: Diagnosis not present

## 2018-09-17 DIAGNOSIS — J019 Acute sinusitis, unspecified: Secondary | ICD-10-CM

## 2018-09-17 DIAGNOSIS — Z85828 Personal history of other malignant neoplasm of skin: Secondary | ICD-10-CM | POA: Diagnosis not present

## 2018-09-17 DIAGNOSIS — L570 Actinic keratosis: Secondary | ICD-10-CM | POA: Diagnosis not present

## 2018-09-17 DIAGNOSIS — D1801 Hemangioma of skin and subcutaneous tissue: Secondary | ICD-10-CM | POA: Diagnosis not present

## 2018-09-17 DIAGNOSIS — D485 Neoplasm of uncertain behavior of skin: Secondary | ICD-10-CM | POA: Diagnosis not present

## 2018-09-17 MED ORDER — AMOXICILLIN-POT CLAVULANATE 875-125 MG PO TABS: 1.0000 | ORAL_TABLET | Freq: Two times a day (BID) | ORAL | 0 refills | Status: DC

## 2018-09-17 NOTE — Progress Notes (Signed)
  Subjective:     Patient ID: Hunter Collins, male   DOB: [birth year], 73 y.o.   MRN: [id number]  HPI Patient seen with acute sinus infection.  He started with symptoms over a month ago.  He had typical cold-like symptoms initially but since that time is had some progressive bilateral maxillary facial pain.  Had greenish nasal discharge and occasional intermittent bloody discharge.  No headache.  No fever.  Still exercising most days.  Has had some increased fatigue issues.  He has frequent dry cough but no dyspnea.  Past Medical History:  Diagnosis Date  . Allergy   . Bradycardia    " MY HEART RATE IS USUALLY AROUD 50 - I EXERCISE A LOT"  . Inguinal hernia    RIGHT   Past Surgical History:  Procedure Laterality Date  . CLOSED REDUCTION ELBOW DISLOCATION     L ELBOW  . INGUINAL HERNIA REPAIR Right 08/13/2015   Procedure: LAPAROSCOPIC RIGHT INGUINAL HERNIA WITH MESH;  Surgeon: Coralie Keens, MD;  Location: [facility];  Service: General;  Laterality: Right;  . INSERTION OF MESH Right 08/13/2015   Procedure: INSERTION OF MESH;  Surgeon: Coralie Keens, MD;  Location: [facility];  Service: General;  Laterality: Right;  . KNEE ARTHROSCOPY  2002   RT KNEE  . TONSILLECTOMY AND ADENOIDECTOMY  1960    reports that he has never smoked. His smokeless tobacco use includes chew. He reports current alcohol use of about 3.0 standard drinks of alcohol per week. He reports that he does not use drugs. family history includes Heart attack in his mother; Pancreatic cancer in his brother. No Known Allergies   Review of Systems  Constitutional: Positive for fatigue. Negative for chills and fever.  HENT: Positive for sinus pressure and sinus pain.   Respiratory: Positive for cough. Negative for shortness of breath.   Neurological: Negative for headaches.       Objective:   Physical Exam Constitutional:      Appearance: Normal appearance.  HENT:     Right Ear: Tympanic membrane normal.     Left Ear:  Tympanic membrane normal.     Mouth/Throat:     Pharynx: Oropharynx is clear. No oropharyngeal exudate.  Neck:     Musculoskeletal: Neck supple.  Cardiovascular:     Pulses: Normal pulses.     Heart sounds: Normal heart sounds.  Pulmonary:     Effort: Pulmonary effort is normal.     Breath sounds: Normal breath sounds. No wheezing or rales.  Neurological:     Mental Status: He is alert.        Assessment:     Acute bilateral maxillary sinusitis following recent viral URI    Plan:     -Augmentin 875 mg twice daily with food -Stay well-hydrated -Follow-up for any persistent or worsening symptoms  [name] MD [facility] at [facility]

## 2018-09-17 NOTE — Patient Instructions (Signed)
Sinusitis, Adult  Sinusitis is inflammation of your sinuses. Sinuses are hollow spaces in the bones around your face. Your sinuses are located:   Around your eyes.   In the middle of your forehead.   Behind your nose.   In your cheekbones.  Mucus normally drains out of your sinuses. When your nasal tissues become inflamed or swollen, mucus can become trapped or blocked. This allows bacteria, viruses, and fungi to grow, which leads to infection. Most infections of the sinuses are caused by a virus.  Sinusitis can develop quickly. It can last for up to 4 weeks (acute) or for more than 12 weeks (chronic). Sinusitis often develops after a cold.  What are the causes?  This condition is caused by anything that creates swelling in the sinuses or stops mucus from draining. This includes:   Allergies.   Asthma.   Infection from bacteria or viruses.   Deformities or blockages in your nose or sinuses.   Abnormal growths in the nose (nasal polyps).   Pollutants, such as chemicals or irritants in the air.   Infection from fungi (rare).  What increases the risk?  You are more likely to develop this condition if you:   Have a weak body defense system (immune system).   Do a lot of swimming or diving.   Overuse nasal sprays.   Smoke.  What are the signs or symptoms?  The main symptoms of this condition are pain and a feeling of pressure around the affected sinuses. Other symptoms include:   Stuffy nose or congestion.   Thick drainage from your nose.   Swelling and warmth over the affected sinuses.   Headache.   Upper toothache.   A cough that may get worse at night.   Extra mucus that collects in the throat or the back of the nose (postnasal drip).   Decreased sense of smell and taste.   Fatigue.   A fever.   Sore throat.   Bad breath.  How is this diagnosed?  This condition is diagnosed based on:   Your symptoms.   Your medical history.   A physical exam.   Tests to find out if your condition is  acute or chronic. This may include:  ? Checking your nose for nasal polyps.  ? Viewing your sinuses using a device that has a light (endoscope).  ? Testing for allergies or bacteria.  ? Imaging tests, such as an MRI or CT scan.  In rare cases, a bone biopsy may be done to rule out more serious types of fungal sinus disease.  How is this treated?  Treatment for sinusitis depends on the cause and whether your condition is chronic or acute.   If caused by a virus, your symptoms should go away on their own within 10 days. You may be given medicines to relieve symptoms. They include:  ? Medicines that shrink swollen nasal passages (topical intranasal decongestants).  ? Medicines that treat allergies (antihistamines).  ? A spray that eases inflammation of the nostrils (topical intranasal corticosteroids).  ? Rinses that help get rid of thick mucus in your nose (nasal saline washes).   If caused by bacteria, your health care provider may recommend waiting to see if your symptoms improve. Most bacterial infections will get better without antibiotic medicine. You may be given antibiotics if you have:  ? A severe infection.  ? A weak immune system.   If caused by narrow nasal passages or nasal polyps, you may need   to have surgery.  Follow these instructions at home:  Medicines   Take, use, or apply over-the-counter and prescription medicines only as told by your health care provider. These may include nasal sprays.   If you were prescribed an antibiotic medicine, take it as told by your health care provider. Do not stop taking the antibiotic even if you start to feel better.  Hydrate and humidify     Drink enough fluid to keep your urine pale yellow. Staying hydrated will help to thin your mucus.   Use a cool mist humidifier to keep the humidity level in your home above 50%.   Inhale steam for 10-15 minutes, 3-4 times a day, or as told by your health care provider. You can do this in the bathroom while a hot shower is  running.   Limit your exposure to cool or dry air.  Rest   Rest as much as possible.   Sleep with your head raised (elevated).   Make sure you get enough sleep each night.  General instructions     Apply a warm, moist washcloth to your face 3-4 times a day or as told by your health care provider. This will help with discomfort.   Wash your hands often with soap and water to reduce your exposure to germs. If soap and water are not available, use hand sanitizer.   Do not smoke. Avoid being around people who are smoking (secondhand smoke).   Keep all follow-up visits as told by your health care provider. This is important.  Contact a health care provider if:   You have a fever.   Your symptoms get worse.   Your symptoms do not improve within 10 days.  Get help right away if:   You have a severe headache.   You have persistent vomiting.   You have severe pain or swelling around your face or eyes.   You have vision problems.   You develop confusion.   Your neck is stiff.   You have trouble breathing.  Summary   Sinusitis is soreness and inflammation of your sinuses. Sinuses are hollow spaces in the bones around your face.   This condition is caused by nasal tissues that become inflamed or swollen. The swelling traps or blocks the flow of mucus. This allows bacteria, viruses, and fungi to grow, which leads to infection.   If you were prescribed an antibiotic medicine, take it as told by your health care provider. Do not stop taking the antibiotic even if you start to feel better.   Keep all follow-up visits as told by your health care provider. This is important.  This information is not intended to replace advice given to you by your health care provider. Make sure you discuss any questions you have with your health care provider.  Document Released: 07/10/2005 Document Revised: 12/10/2017 Document Reviewed: 12/10/2017  Elsevier Interactive Patient Education  2019 [employer]

## 2019-02-14 ENCOUNTER — Other Ambulatory Visit: Payer: Self-pay

## 2019-02-14 ENCOUNTER — Ambulatory Visit (HOSPITAL_COMMUNITY)
Admission: EM | Admit: 2019-02-14 | Discharge: 2019-02-14 | Disposition: A | Discharge status: Home / Self Care | Payer: Medicare HMO | Attending: Family Medicine | Admitting: Family Medicine

## 2019-02-14 ENCOUNTER — Encounter (HOSPITAL_COMMUNITY): Payer: Self-pay

## 2019-02-14 DIAGNOSIS — Z20828 Contact with and (suspected) exposure to other viral communicable diseases: Secondary | ICD-10-CM | POA: Insufficient documentation

## 2019-02-14 DIAGNOSIS — Z20822 Contact with and (suspected) exposure to covid-19: Secondary | ICD-10-CM

## 2019-02-14 NOTE — ED Triage Notes (Signed)
Pt presents for Covid Testing as requested by his employer; pt states he does not have any symptoms.

## 2019-02-14 NOTE — Discharge Instructions (Signed)
COVID testing done here today. We will call you with positive results

## 2019-02-14 NOTE — ED Provider Notes (Signed)
[place]    CSN: [id number] Arrival date & time: 02/14/19  1354     History   Chief Complaint Chief Complaint  Patient presents with  . COVID TESTING    HPI Hunter Collins is a 73 y.o. male.   Pt is a healthy 73 year old male that comes in today for COVID testing.  Reporting that his job made him aware that he had potentially been exposed at work.  They sent him for testing.  He is denying any current symptoms and feels well.     Past Medical History:  Diagnosis Date  . Allergy   . Bradycardia    " MY HEART RATE IS USUALLY AROUD 50 - I EXERCISE A LOT"  . Inguinal hernia    RIGHT    Patient Active Problem List   Diagnosis Date Noted  . Unilateral primary osteoarthritis, right knee 07/25/2016  . HYPERLIPIDEMIA 04/12/2009    Past Surgical History:  Procedure Laterality Date  . CLOSED REDUCTION ELBOW DISLOCATION     L ELBOW  . INGUINAL HERNIA REPAIR Right 08/13/2015   Procedure: LAPAROSCOPIC RIGHT INGUINAL HERNIA WITH MESH;  Surgeon: Coralie Keens, MD;  Location: [facility];  Service: General;  Laterality: Right;  . INSERTION OF MESH Right 08/13/2015   Procedure: INSERTION OF MESH;  Surgeon: Coralie Keens, MD;  Location: [facility];  Service: General;  Laterality: Right;  . KNEE ARTHROSCOPY  2002   RT KNEE  . TONSILLECTOMY AND ADENOIDECTOMY  1960       Home Medications    Prior to Admission medications   Medication Sig Start Date End Date Taking? Authorizing Provider  diphenhydrAMINE (BENADRYL) 25 mg capsule Take 25 mg by mouth every 6 (six) hours as needed.    [provider]  glucosamine-chondroitin 500-400 MG tablet Take 1 tablet by mouth daily.    [provider]  ibuprofen (ADVIL,MOTRIN) 200 MG tablet Take 800 mg by mouth every 6 (six) hours as needed for moderate pain.    [provider]  Multiple Vitamin (MULTIVITAMIN WITH MINERALS) TABS tablet Take 1 tablet by mouth daily.    [provider]  Omega-3  Fatty Acids (FISH OIL) 1000 MG CAPS Take by mouth. Take One Capsule Daily.    [provider]    Family History Family History  Problem Relation Age of Onset  . Heart attack Mother   . Pancreatic cancer Brother   . Colon cancer Neg Hx     Social History Social History   Tobacco Use  . Smoking status: Never Smoker  . Smokeless tobacco: Current User    Types: Chew  Substance Use Topics  . Alcohol use: Yes    Alcohol/week: 3.0 standard drinks    Types: 3 Shots of liquor per week    Comment: weekends  . Drug use: No     Allergies   Patient has no known allergies.   Review of Systems Review of Systems  Constitutional: Negative for activity change, appetite change, chills, diaphoresis, fatigue, fever and unexpected weight change.  HENT: Negative for congestion.   Respiratory: Negative for cough, chest tightness and shortness of breath.   Cardiovascular: Negative for chest pain.  Neurological: Negative for headaches.     Physical Exam Triage Vital Signs ED Triage Vitals  Enc Vitals Group     BP 02/14/19 1421 129/88     Pulse Rate 02/14/19 1421 62     Resp 02/14/19 1421 17     Temp  02/14/19 1421 98.6 F (37 C)     Temp Source 02/14/19 1421 Oral     SpO2 02/14/19 1421 98 %     Weight --      Height --      Head Circumference --      Peak Flow --      Pain Score 02/14/19 1442 0     Pain Loc --      Pain Edu? --      Excl. in [place]? --    No data found.  Updated Vital Signs BP 129/88 (BP Location: Left Arm)   Pulse 62   Temp 98.6 F (37 C) (Oral)   Resp 17   SpO2 98%   Visual Acuity Right Eye Distance:   Left Eye Distance:   Bilateral Distance:    Right Eye Near:   Left Eye Near:    Bilateral Near:     Physical Exam Vitals signs and nursing note reviewed.  Constitutional:      General: He is not in acute distress.    Appearance: Normal appearance. He is well-developed. He is not ill-appearing, toxic-appearing or diaphoretic.  HENT:      Head: Normocephalic and atraumatic.     Nose: Nose normal.  Eyes:     Conjunctiva/sclera: Conjunctivae normal.  Neck:     Musculoskeletal: Normal range of motion and neck supple.  Cardiovascular:     Heart sounds: No murmur.  Pulmonary:     Effort: Pulmonary effort is normal.  Musculoskeletal: Normal range of motion.  Skin:    General: Skin is warm and dry.  Neurological:     Mental Status: He is alert.  Psychiatric:        Mood and Affect: Mood normal.      UC Treatments / Results  Labs (all labs ordered are listed, but only abnormal results are displayed) Labs Reviewed  NOVEL CORONAVIRUS, NAA (HOSPITAL ORDER, SEND-OUT TO REF LAB)    EKG   Radiology No results found.  Procedures Procedures (including critical care time)  Medications Ordered in UC Medications - No data to display  Initial Impression / Assessment and Plan / UC Course  I have reviewed the triage vital signs and the nursing notes.  Pertinent labs & imaging results that were available during my care of the patient were reviewed by me and considered in my medical decision making (see chart for details).     COVID testing done. Work note given Follow up as needed for continued or worsening symptoms  Final Clinical Impressions(s) / UC Diagnoses   Final diagnoses:  Exposure to Covid-19 Virus     Discharge Instructions     COVID testing done here today. We will call you with positive results     ED Prescriptions    None     Controlled Substance Prescriptions [place] Controlled Substance Registry consulted? Not Applicable   Orvan July, NP 02/14/19 1523

## 2019-02-15 ENCOUNTER — Encounter: Payer: Self-pay | Admitting: Family Medicine

## 2019-02-15 LAB — NOVEL CORONAVIRUS, NAA (HOSP ORDER, SEND-OUT TO REF LAB; TAT 18-24 HRS): SARS-CoV-2, NAA: NOT DETECTED

## 2019-02-17 ENCOUNTER — Encounter (HOSPITAL_COMMUNITY): Payer: Self-pay

## 2019-03-18 DIAGNOSIS — L814 Other melanin hyperpigmentation: Secondary | ICD-10-CM | POA: Diagnosis not present

## 2019-03-18 DIAGNOSIS — Z85828 Personal history of other malignant neoplasm of skin: Secondary | ICD-10-CM | POA: Diagnosis not present

## 2019-03-18 DIAGNOSIS — L570 Actinic keratosis: Secondary | ICD-10-CM | POA: Diagnosis not present

## 2019-03-18 DIAGNOSIS — D485 Neoplasm of uncertain behavior of skin: Secondary | ICD-10-CM | POA: Diagnosis not present

## 2019-03-18 DIAGNOSIS — D225 Melanocytic nevi of trunk: Secondary | ICD-10-CM | POA: Diagnosis not present

## 2019-03-18 DIAGNOSIS — D1801 Hemangioma of skin and subcutaneous tissue: Secondary | ICD-10-CM | POA: Diagnosis not present

## 2019-03-18 DIAGNOSIS — L821 Other seborrheic keratosis: Secondary | ICD-10-CM | POA: Diagnosis not present

## 2019-07-23 ENCOUNTER — Encounter: Payer: Medicare HMO | Admitting: Family Medicine

## 2019-07-23 ENCOUNTER — Other Ambulatory Visit: Payer: Self-pay

## 2019-07-23 ENCOUNTER — Ambulatory Visit (INDEPENDENT_AMBULATORY_CARE_PROVIDER_SITE_OTHER): Payer: Medicare HMO | Admitting: Family Medicine

## 2019-07-23 ENCOUNTER — Encounter: Payer: Self-pay | Admitting: Family Medicine

## 2019-07-23 VITALS — BP 116/72 | HR 61 | Temp 97.2°F | Ht 67.5 in | Wt 184.2 lb

## 2019-07-23 DIAGNOSIS — Z0000 Encounter for general adult medical examination without abnormal findings: Secondary | ICD-10-CM | POA: Diagnosis not present

## 2019-07-23 LAB — BASIC METABOLIC PANEL
BUN: 23 mg/dL (ref 6–23)
CO2: 27 mEq/L (ref 19–32)
Calcium: 9.8 mg/dL (ref 8.4–10.5)
Chloride: 104 mEq/L (ref 96–112)
Creatinine, Ser: 0.97 mg/dL (ref 0.40–1.50)
GFR: 75.28 mL/min (ref >60.00)
Glucose, Bld: 88 mg/dL (ref 70–99)
Potassium: 4.9 mEq/L (ref 3.5–5.1)
Sodium: 137 mEq/L (ref 135–145)

## 2019-07-23 LAB — CBC WITH DIFFERENTIAL/PLATELET
Basophils Absolute: 0 10*3/uL (ref 0.0–0.1)
Basophils Relative: 0.7 % (ref 0.0–3.0)
Eosinophils Absolute: 0.1 10*3/uL (ref 0.0–0.7)
Eosinophils Relative: 1.2 % (ref 0.0–5.0)
HCT: 44 % (ref 39.0–52.0)
Hemoglobin: 14.8 g/dL (ref 13.0–17.0)
Lymphocytes Relative: 33.4 % (ref 12.0–46.0)
Lymphs Abs: 1.5 10*3/uL (ref 0.7–4.0)
MCHC: 33.6 g/dL (ref 30.0–36.0)
MCV: 98 fl (ref 78.0–100.0)
Monocytes Absolute: 0.5 10*3/uL (ref 0.1–1.0)
Monocytes Relative: 10.8 % (ref 3.0–12.0)
Neutro Abs: 2.4 10*3/uL (ref 1.4–7.7)
Neutrophils Relative %: 53.9 % (ref 43.0–77.0)
Platelets: 194 10*3/uL (ref 150.0–400.0)
RBC: 4.5 Mil/uL (ref 4.22–5.81)
RDW: 13.3 % (ref 11.5–15.5)
WBC: 4.4 10*3/uL (ref 4.0–10.5)

## 2019-07-23 LAB — LIPID PANEL
Cholesterol: 281 mg/dL — ABNORMAL HIGH (ref 0–200)
HDL: 47.2 mg/dL (ref >39.00)
LDL Cholesterol: 206 mg/dL — ABNORMAL HIGH (ref 0–99)
NonHDL: 233.35
Total CHOL/HDL Ratio: 6
Triglycerides: 135 mg/dL (ref 0.0–149.0)
VLDL: 27 mg/dL (ref 0.0–40.0)

## 2019-07-23 LAB — HEPATIC FUNCTION PANEL
ALT: 23 U/L (ref 0–53)
AST: 23 U/L (ref 0–37)
Albumin: 4.4 g/dL (ref 3.5–5.2)
Alkaline Phosphatase: 57 U/L (ref 39–117)
Bilirubin, Direct: 0.1 mg/dL (ref 0.0–0.3)
Total Bilirubin: 0.7 mg/dL (ref 0.2–1.2)
Total Protein: 6.7 g/dL (ref 6.0–8.3)

## 2019-07-23 LAB — TSH: TSH: 2.01 u[IU]/mL (ref 0.35–4.50)

## 2019-07-23 NOTE — Patient Instructions (Signed)

## 2019-07-23 NOTE — Progress Notes (Addendum)
Subjective:     Patient ID: Hunter Collins, male   DOB: [birth year], 73 y.o.   MRN: [id number]  HPI Mr. Kuster is seen for physical exam.  Generally very healthy.  Takes no regular prescription medications.  He continues to work full-time at [employer].  He also works for [employer] part-time in [employer]  Never smoked.  Still exercises regularly with walking.  Some jogging.  No recent falls.  He is followed by dermatologist every 6 months for skin checks.  Health maintenance reviewed  -Tetanus due 2029 -Colonoscopy due 3/22 -Previous hepatitis C screen negative -Pneumonia vaccines complete -Flu vaccine given in September 2020  Past Medical History:  Diagnosis Date  . Allergy   . Bradycardia    " MY HEART RATE IS USUALLY AROUD 50 - I EXERCISE A LOT"  . Inguinal hernia    RIGHT   Past Surgical History:  Procedure Laterality Date  . CLOSED REDUCTION ELBOW DISLOCATION     L ELBOW  . INGUINAL HERNIA REPAIR Right 08/13/2015   Procedure: LAPAROSCOPIC RIGHT INGUINAL HERNIA WITH MESH;  Surgeon: Coralie Keens, MD;  Location: [facility];  Service: General;  Laterality: Right;  . INSERTION OF MESH Right 08/13/2015   Procedure: INSERTION OF MESH;  Surgeon: Coralie Keens, MD;  Location: [facility];  Service: General;  Laterality: Right;  . KNEE ARTHROSCOPY  2002   RT KNEE  . TONSILLECTOMY AND ADENOIDECTOMY  1960    reports that he has never smoked. His smokeless tobacco use includes chew. He reports current alcohol use of about 3.0 standard drinks of alcohol per week. He reports that he does not use drugs. family history includes Heart attack in his mother; Pancreatic cancer in his brother. No Known Allergies  Wt Readings from Last 3 Encounters:  07/23/19 184 lb 3.2 oz (83.6 kg)  09/17/18 180 lb 14.4 oz (82.1 kg)  07/15/18 175 lb 12.8 oz (79.7 kg)   The 10-year ASCVD risk score Mikey Bussing DC Jr., et al., 2013) is: 24%   Values used to calculate the score:      Age: 51 years     Sex: Male     Is Non-Hispanic African American: No     Diabetic: No     Tobacco smoker: No     Systolic Blood Pressure: 99991111 mmHg     Is BP treated: No     HDL Cholesterol: 47.2 mg/dL     Total Cholesterol: 281 mg/dL    Review of Systems  Constitutional: Negative for activity change, appetite change, fatigue and fever.  HENT: Negative for congestion, ear pain and trouble swallowing.   Eyes: Negative for pain and visual disturbance.  Respiratory: Negative for cough, shortness of breath and wheezing.   Cardiovascular: Negative for chest pain and palpitations.  Gastrointestinal: Negative for abdominal distention, abdominal pain, blood in stool, constipation, diarrhea, nausea, rectal pain and vomiting.  Endocrine: Negative for polydipsia and polyuria.  Genitourinary: Negative for dysuria, hematuria and testicular pain.  Musculoskeletal: Negative for arthralgias and joint swelling.  Skin: Negative for rash.  Neurological: Negative for dizziness, syncope and headaches.  Hematological: Negative for adenopathy.  Psychiatric/Behavioral: Negative for confusion and dysphoric mood.       Objective:   Physical Exam Constitutional:      General: He is not in acute distress.    Appearance: He is well-developed.  HENT:     Head: Normocephalic and atraumatic.     Right Ear: External ear normal.  Left Ear: External ear normal.  Eyes:     Conjunctiva/sclera: Conjunctivae normal.     Pupils: Pupils are equal, round, and reactive to light.  Neck:     Thyroid: No thyromegaly.  Cardiovascular:     Rate and Rhythm: Normal rate and regular rhythm.     Heart sounds: Normal heart sounds. No murmur.  Pulmonary:     Effort: No respiratory distress.     Breath sounds: No wheezing or rales.  Abdominal:     General: Bowel sounds are normal. There is no distension.     Palpations: Abdomen is soft. There is no mass.     Tenderness: There is no abdominal tenderness. There is no  guarding or rebound.  Musculoskeletal:     Cervical back: Normal range of motion and neck supple.  Lymphadenopathy:     Cervical: No cervical adenopathy.  Skin:    Findings: No rash.  Neurological:     Mental Status: He is alert and oriented to person, place, and time.     Cranial Nerves: No cranial nerve deficit.     Deep Tendon Reflexes: Reflexes normal.        Assessment:     Physical exam.  Generally very healthy 73 year old male.  He has had some modest weight gain over the past year.  We discussed the following issues    Plan:     -Discussed pros and cons of PSA screening and reviewed [place] preventative task force guidelines regarding PSA screening for beyond 70 and he declines  -Continue regular exercise habits  -Obtain follow-up labs  -Continue yearly flu vaccine  [name] MD [facility]

## 2019-08-13 ENCOUNTER — Ambulatory Visit: Payer: Medicare HMO | Attending: Internal Medicine

## 2019-08-13 DIAGNOSIS — Z23 Encounter for immunization: Secondary | ICD-10-CM | POA: Insufficient documentation

## 2019-08-13 NOTE — Progress Notes (Signed)
   Covid-19 Vaccination Clinic  Name:  Hunter Collins    MRN: [id number] DOB: 03/24/1946  08/13/2019  Hunter Collins was observed post Covid-19 immunization for 15 minutes without incidence. He was provided with Vaccine Information Sheet and instruction to access the V-Safe system.   Hunter Collins was instructed to call 911 with any severe reactions post vaccine: Marland Kitchen Difficulty breathing  . Swelling of your face and throat  . A fast heartbeat  . A bad rash all over your body  . Dizziness and weakness    Immunizations Administered    Name Date Dose VIS Date Route   Pfizer COVID-19 Vaccine 08/13/2019  8:28 AM 0.3 mL 07/04/2019 Intramuscular   Manufacturer: [place]   Lot: [id number]   [place]: [id number]

## 2019-08-28 DIAGNOSIS — H524 Presbyopia: Secondary | ICD-10-CM | POA: Diagnosis not present

## 2019-08-30 DIAGNOSIS — Z0100 Encounter for examination of eyes and vision without abnormal findings: Secondary | ICD-10-CM | POA: Diagnosis not present

## 2019-09-01 ENCOUNTER — Ambulatory Visit: Payer: Medicare HMO | Attending: Internal Medicine

## 2019-09-01 DIAGNOSIS — Z23 Encounter for immunization: Secondary | ICD-10-CM

## 2019-09-01 NOTE — Progress Notes (Signed)
   Covid-19 Vaccination Clinic  Name:  Hunter Collins    MRN: [id number] DOB: 03-23-1946  09/01/2019  Mr. Hunter Collins was observed post Covid-19 immunization for 15 minutes without incidence. He was provided with Vaccine Information Sheet and instruction to access the V-Safe system.   Mr. Hunter Collins was instructed to call 911 with any severe reactions post vaccine: Marland Kitchen Difficulty breathing  . Swelling of your face and throat  . A fast heartbeat  . A bad rash all over your body  . Dizziness and weakness    Immunizations Administered    Name Date Dose VIS Date Route   Pfizer COVID-19 Vaccine 09/01/2019 11:04 AM 0.3 mL 07/04/2019 Intramuscular   Manufacturer: [place]   Lot: [id number]   [place]: [id number]

## 2019-09-12 ENCOUNTER — Other Ambulatory Visit: Payer: Self-pay

## 2019-09-15 ENCOUNTER — Other Ambulatory Visit: Payer: Self-pay | Admitting: Family Medicine

## 2019-09-15 ENCOUNTER — Other Ambulatory Visit (INDEPENDENT_AMBULATORY_CARE_PROVIDER_SITE_OTHER): Payer: Medicare HMO

## 2019-09-15 ENCOUNTER — Other Ambulatory Visit: Payer: Self-pay

## 2019-09-15 DIAGNOSIS — E785 Hyperlipidemia, unspecified: Secondary | ICD-10-CM | POA: Diagnosis not present

## 2019-09-16 ENCOUNTER — Ambulatory Visit: Payer: Medicare HMO

## 2019-09-16 ENCOUNTER — Other Ambulatory Visit: Payer: Self-pay

## 2019-09-16 ENCOUNTER — Encounter: Payer: Self-pay | Admitting: Family Medicine

## 2019-09-16 DIAGNOSIS — E785 Hyperlipidemia, unspecified: Secondary | ICD-10-CM

## 2019-09-16 LAB — LIPID PANEL
Cholesterol: 291 mg/dL — ABNORMAL HIGH (ref 0–200)
HDL: 52.9 mg/dL (ref >39.00)
LDL Cholesterol: 210 mg/dL — ABNORMAL HIGH (ref 0–99)
NonHDL: 237.68
Total CHOL/HDL Ratio: 5
Triglycerides: 136 mg/dL (ref 0.0–149.0)
VLDL: 27.2 mg/dL (ref 0.0–40.0)

## 2019-09-16 LAB — HEPATIC FUNCTION PANEL
ALT: 21 U/L (ref 0–53)
AST: 18 U/L (ref 0–37)
Albumin: 4.5 g/dL (ref 3.5–5.2)
Alkaline Phosphatase: 64 U/L (ref 39–117)
Bilirubin, Direct: 0.1 mg/dL (ref 0.0–0.3)
Total Bilirubin: 0.7 mg/dL (ref 0.2–1.2)
Total Protein: 6.8 g/dL (ref 6.0–8.3)

## 2019-09-16 MED ORDER — ROSUVASTATIN CALCIUM 10 MG PO TABS: 10.0000 mg | ORAL_TABLET | Freq: Every day | ORAL | 3 refills | Status: DC

## 2019-09-17 ENCOUNTER — Encounter: Payer: Self-pay | Admitting: Family Medicine

## 2019-09-18 DIAGNOSIS — L570 Actinic keratosis: Secondary | ICD-10-CM | POA: Diagnosis not present

## 2019-09-18 DIAGNOSIS — D225 Melanocytic nevi of trunk: Secondary | ICD-10-CM | POA: Diagnosis not present

## 2019-09-18 DIAGNOSIS — L905 Scar conditions and fibrosis of skin: Secondary | ICD-10-CM | POA: Diagnosis not present

## 2019-09-18 DIAGNOSIS — L84 Corns and callosities: Secondary | ICD-10-CM | POA: Diagnosis not present

## 2019-09-18 DIAGNOSIS — Z85828 Personal history of other malignant neoplasm of skin: Secondary | ICD-10-CM | POA: Diagnosis not present

## 2019-09-18 DIAGNOSIS — L821 Other seborrheic keratosis: Secondary | ICD-10-CM | POA: Diagnosis not present

## 2019-09-18 DIAGNOSIS — D1801 Hemangioma of skin and subcutaneous tissue: Secondary | ICD-10-CM | POA: Diagnosis not present

## 2019-09-18 DIAGNOSIS — L814 Other melanin hyperpigmentation: Secondary | ICD-10-CM | POA: Diagnosis not present

## 2019-11-12 ENCOUNTER — Other Ambulatory Visit: Payer: Self-pay

## 2019-11-13 ENCOUNTER — Other Ambulatory Visit (INDEPENDENT_AMBULATORY_CARE_PROVIDER_SITE_OTHER): Payer: Medicare HMO

## 2019-11-13 DIAGNOSIS — E785 Hyperlipidemia, unspecified: Secondary | ICD-10-CM

## 2019-11-13 LAB — HEPATIC FUNCTION PANEL
ALT: 21 U/L (ref 0–53)
AST: 19 U/L (ref 0–37)
Albumin: 4.4 g/dL (ref 3.5–5.2)
Alkaline Phosphatase: 55 U/L (ref 39–117)
Bilirubin, Direct: 0.2 mg/dL (ref 0.0–0.3)
Total Bilirubin: 0.8 mg/dL (ref 0.2–1.2)
Total Protein: 6.7 g/dL (ref 6.0–8.3)

## 2019-11-13 LAB — LIPID PANEL
Cholesterol: 177 mg/dL (ref 0–200)
HDL: 48.3 mg/dL (ref >39.00)
LDL Cholesterol: 108 mg/dL — ABNORMAL HIGH (ref 0–99)
NonHDL: 129.01
Total CHOL/HDL Ratio: 4
Triglycerides: 105 mg/dL (ref 0.0–149.0)
VLDL: 21 mg/dL (ref 0.0–40.0)

## 2020-01-30 ENCOUNTER — Ambulatory Visit (INDEPENDENT_AMBULATORY_CARE_PROVIDER_SITE_OTHER): Payer: Medicare HMO | Admitting: Podiatry

## 2020-01-30 ENCOUNTER — Other Ambulatory Visit: Payer: Self-pay

## 2020-01-30 ENCOUNTER — Encounter: Payer: Self-pay | Admitting: Podiatry

## 2020-01-30 DIAGNOSIS — M79674 Pain in right toe(s): Secondary | ICD-10-CM

## 2020-01-30 DIAGNOSIS — M79675 Pain in left toe(s): Secondary | ICD-10-CM | POA: Diagnosis not present

## 2020-01-30 DIAGNOSIS — B351 Tinea unguium: Secondary | ICD-10-CM | POA: Diagnosis not present

## 2020-01-30 NOTE — Progress Notes (Signed)
This patient returns to the office for evaluation and treatment of long thick painful nails .  This patient is unable to trim his own nails since the patient cannot reach his feet.  Patient says the nails are painful walking and wearing his shoes.  He returns for preventive foot care services.  General Appearance  Alert, conversant and in no acute stress.  Vascular  Dorsalis pedis and posterior tibial  pulses are palpable  bilaterally.  Capillary return is within normal limits  bilaterally. Temperature is within normal limits  bilaterally.  Neurologic  Senn-Weinstein monofilament wire test within normal limits  bilaterally. Muscle power within normal limits bilaterally.  Nails Thick disfigured discolored nails with subungual debris second toenail  B/L and third toenail right foot.. No evidence of bacterial infection or drainage bilaterally.  Orthopedic  No limitations of motion  feet .  No crepitus or effusions noted.  No bony pathology or digital deformities noted.  Skin  normotropic skin with no porokeratosis noted bilaterally.  No signs of infections or ulcers noted.     Onychomycosis  Pain in toes right foot  Pain in toes left foot  Debridement  of nails   B/L with a nail nipper.  Nails were then filed using a dremel tool with no incidents.    RTC  4 months    Gardiner Barefoot DPM

## 2020-03-14 ENCOUNTER — Encounter: Payer: Self-pay | Admitting: Family Medicine

## 2020-03-18 ENCOUNTER — Encounter: Payer: Self-pay | Admitting: Family Medicine

## 2020-03-22 ENCOUNTER — Other Ambulatory Visit: Payer: Self-pay

## 2020-03-22 ENCOUNTER — Other Ambulatory Visit: Payer: Self-pay | Admitting: Family Medicine

## 2020-03-22 ENCOUNTER — Ambulatory Visit (INDEPENDENT_AMBULATORY_CARE_PROVIDER_SITE_OTHER): Payer: Medicare HMO | Admitting: Family Medicine

## 2020-03-22 ENCOUNTER — Encounter: Payer: Self-pay | Admitting: Family Medicine

## 2020-03-22 DIAGNOSIS — R600 Localized edema: Secondary | ICD-10-CM | POA: Diagnosis not present

## 2020-03-22 MED ORDER — FUROSEMIDE 20 MG PO TABS: 20.0000 mg | ORAL_TABLET | Freq: Every day | ORAL | 1 refills | Status: DC

## 2020-03-22 NOTE — Progress Notes (Signed)
Established Patient Office Visit  Subjective:  Patient ID: Hunter Collins, male    DOB: April 29, 1946  Age: 74 y.o. MRN: [id number]  CC:  Chief Complaint  Patient presents with  . Leg Swelling    bi lateral leg swelling. Sx 2 months more in Left leg causing unstatable gait.    HPI Hunter Collins presents for 2+ month history of bilateral leg swelling left greater than right.  He has history of "clubfoot "involving the left foot predominantly.  With increasing edema he has noted increased left foot pain.  He does see a podiatrist regularly.  He is generally very healthy.  Went on Crestor in the past year for hyperlipidemia.  He does take Advil fairly frequently.  His edema goes down at night and is worse during the day.  No orthopnea.  No dyspnea.  No recent chest pains.  He has iced his legs occasionally which seems to help.  Stays very active.   Has gained about 8 pounds since February.  He thinks this is mostly dietary related.  Previous thyroid testing and renal function normal last December.    Past Medical History:  Diagnosis Date  . Allergy   . Bradycardia    " MY HEART RATE IS USUALLY AROUD 50 - I EXERCISE A LOT"  . Inguinal hernia    RIGHT    Past Surgical History:  Procedure Laterality Date  . CLOSED REDUCTION ELBOW DISLOCATION     L ELBOW  . INGUINAL HERNIA REPAIR Right 08/13/2015   Procedure: LAPAROSCOPIC RIGHT INGUINAL HERNIA WITH MESH;  Surgeon: Coralie Keens, MD;  Location: [facility];  Service: General;  Laterality: Right;  . INSERTION OF MESH Right 08/13/2015   Procedure: INSERTION OF MESH;  Surgeon: Coralie Keens, MD;  Location: [facility];  Service: General;  Laterality: Right;  . KNEE ARTHROSCOPY  2002   RT KNEE  . TONSILLECTOMY AND ADENOIDECTOMY  1960    Family History  Problem Relation Age of Onset  . Heart attack Mother   . Pancreatic cancer Brother   . Colon cancer Neg Hx     Social History   Socioeconomic History  . Marital status: Married     Spouse name: Not on file  . Number of children: Not on file  . Years of education: Not on file  . Highest education level: Not on file  Occupational History  . Not on file  Tobacco Use  . Smoking status: Never Smoker  . Smokeless tobacco: Current User    Types: Chew  Vaping Use  . Vaping Use: Never used  Substance and Sexual Activity  . Alcohol use: Yes    Alcohol/week: 3.0 standard drinks    Types: 3 Shots of liquor per week    Comment: weekends  . Drug use: No  . Sexual activity: Not on file  Other Topics Concern  . Not on file  Social History Narrative  . Not on file   Social Determinants of Health   Financial Resource Strain:   . Difficulty of Paying Living Expenses: Not on file  Food Insecurity:   . Worried About Charity fundraiser in the Last Year: Not on file  . Ran Out of Food in the Last Year: Not on file  Transportation Needs:   . Lack of Transportation (Medical): Not on file  . Lack of Transportation (Non-Medical): Not on file  Physical Activity:   . Days of Exercise per Week: Not on file  . Minutes  of Exercise per Session: Not on file  Stress:   . Feeling of Stress : Not on file  Social Connections:   . Frequency of Communication with Friends and Family: Not on file  . Frequency of Social Gatherings with Friends and Family: Not on file  . Attends Religious Services: Not on file  . Active Member of Clubs or Organizations: Not on file  . Attends Archivist Meetings: Not on file  . Marital Status: Not on file  Intimate Partner Violence:   . Fear of Current or Ex-Partner: Not on file  . Emotionally Abused: Not on file  . Physically Abused: Not on file  . Sexually Abused: Not on file    Outpatient Medications Prior to Visit  Medication Sig Dispense Refill  . diphenhydrAMINE (BENADRYL) 25 mg capsule Take 25 mg by mouth every 6 (six) hours as needed.    Marland Kitchen glucosamine-chondroitin 500-400 MG tablet Take 1 tablet by mouth daily.    Marland Kitchen ibuprofen  (ADVIL,MOTRIN) 200 MG tablet Take 800 mg by mouth every 6 (six) hours as needed for moderate pain.    . Multiple Vitamin (MULTIVITAMIN WITH MINERALS) TABS tablet Take 1 tablet by mouth daily.    . Omega-3 Fatty Acids (FISH OIL) 1000 MG CAPS Take by mouth. Take One Capsule Daily.    . rosuvastatin (CRESTOR) 10 MG tablet Take 1 tablet (10 mg total) by mouth daily. 90 tablet 3   No facility-administered medications prior to visit.    No Known Allergies  ROS Review of Systems  Constitutional: Negative for chills, fatigue and fever.  Respiratory: Negative for shortness of breath and wheezing.   Cardiovascular: Positive for leg swelling. Negative for chest pain.  Gastrointestinal: Negative for abdominal pain.      Objective:    Physical Exam Vitals reviewed.  Constitutional:      Appearance: Normal appearance.  Cardiovascular:     Rate and Rhythm: Normal rate and regular rhythm.     Heart sounds: No gallop.   Pulmonary:     Effort: Pulmonary effort is normal.     Breath sounds: Normal breath sounds. No wheezing or rales.  Musculoskeletal:     Comments: He has trace to 1+ pitting edema in both lower extremities left slightly greater than right.  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     There were no vitals taken for this visit. Wt Readings from Last 3 Encounters:  07/23/19 184 lb 3.2 oz (83.6 kg)  09/17/18 180 lb 14.4 oz (82.1 kg)  07/15/18 175 lb 12.8 oz (79.7 kg)     Health Maintenance Due  Topic Date Due  . INFLUENZA VACCINE  02/22/2020    There are no preventive care reminders to display for this patient.  Lab Results  Component Value Date   TSH 2.01 07/23/2019   Lab Results  Component Value Date   WBC 4.4 07/23/2019   HGB 14.8 07/23/2019   HCT 44.0 07/23/2019   MCV 98.0  07/23/2019   PLT 194.0 07/23/2019   Lab Results  Component Value Date   NA 137 07/23/2019   K 4.9 07/23/2019   CO2 27 07/23/2019   GLUCOSE 88 07/23/2019   BUN 23 07/23/2019   CREATININE 0.97 07/23/2019   BILITOT 0.8 11/13/2019   ALKPHOS 55 11/13/2019  AST 19 11/13/2019   ALT 21 11/13/2019   PROT 6.7 11/13/2019   ALBUMIN 4.4 11/13/2019   CALCIUM 9.8 07/23/2019   GFR 75.28 07/23/2019   Lab Results  Component Value Date   CHOL 177 11/13/2019   Lab Results  Component Value Date   HDL 48.30 11/13/2019   Lab Results  Component Value Date   LDLCALC 108 (H) 11/13/2019   Lab Results  Component Value Date   TRIG 105.0 11/13/2019   Lab Results  Component Value Date   CHOLHDL 4 11/13/2019   No results found for: HGBA1C    Assessment & Plan:   66-month history of progressive bilateral leg edema left greater than right.  Suspect related to venous stasis predominantly.  Symptoms consistently worse late day.  He does not have any dyspnea or orthopnea  -Recommend frequent leg elevation -Prescription for knee-high venous compression garments 15 to 20 mm -Avoid nonsteroidals as much as possible -Short-term use of furosemide 20 mg once daily for 3 to 4 days then try leaving off and also recommend high potassium diet when using furosemide -Reassess in 1 month.  If not improving at that point consider further evaluation with labs and possible echocardiogram -He will try leaving off the Crestor until follow-up even though we explained his edema is not likely related to that.  We also suspect his foot pain is probably related to the increased edema and less likely statin related  Meds ordered this encounter  Medications  . furosemide (LASIX) 20 MG tablet    Sig: Take 1 tablet (20 mg total) by mouth daily.    Dispense:  30 tablet    Refill:  1    Follow-up: Return in about 1 month (around 04/22/2020).    Carolann Littler, MD

## 2020-03-22 NOTE — Patient Instructions (Signed)

## 2020-03-23 ENCOUNTER — Encounter: Payer: Self-pay | Admitting: Podiatrist

## 2020-03-23 ENCOUNTER — Ambulatory Visit (INDEPENDENT_AMBULATORY_CARE_PROVIDER_SITE_OTHER): Payer: Medicare HMO | Admitting: Podiatrist

## 2020-03-23 ENCOUNTER — Other Ambulatory Visit: Payer: Self-pay

## 2020-03-23 DIAGNOSIS — S99922A Unspecified injury of left foot, initial encounter: Secondary | ICD-10-CM | POA: Diagnosis not present

## 2020-03-23 DIAGNOSIS — M7752 Other enthesopathy of left foot: Secondary | ICD-10-CM | POA: Diagnosis not present

## 2020-03-23 DIAGNOSIS — M7750 Other enthesopathy of unspecified foot: Secondary | ICD-10-CM

## 2020-03-23 NOTE — Patient Instructions (Signed)
Foot Injection Care After Refer to this sheet in the next few days. These instructions provide you with information on caring for yourself after you have had an injection. Your caregiver also may give you more specific instructions. Your treatment has been planned according to current medical practices, but problems sometimes occur. Call your caregiver if you have any problems or questions after your procedure. After any type of injection, it is not uncommon to experience:  Soreness, swelling, or bruising around the injection site.  Mild numbness, tingling, or weakness around the injection site caused by the numbing medicine used before or with the injection.  Corticosteroids (These effects are rare.):  Allergic reaction.  Increased blood sugar levels (If you have diabetes and you notice that your blood sugar levels have increased, notify your caregiver).  Increased blood pressure levels.   HOME CARE INSTRUCTIONS  Limit yourself to light activity the day of your procedure. Avoid lifting heavy objects, bending, stooping, or twisting.  Take prescription or over-the-counter pain medication as directed by your caregiver.  You may apply ice to your injection site to reduce pain and swelling the day of your procedure. Ice may be applied 03-04 times:  Put ice in a plastic bag.  Place a towel between your skin and the bag.  Leave the ice on for no longer than 15-20 minutes each time. SEEK IMMEDIATE MEDICAL CARE IF:   Pain and swelling get worse rather than better or extend beyond the injection site.  Numbness does not go away.  Blood or fluid continues to leak from the injection site.  You have chest pain.  You have swelling of your face or tongue.  You have trouble breathing or you become dizzy.  You develop a fever, chills, or severe tenderness at the injection site that last longer than 1 day. MAKE SURE YOU:  Understand these instructions.  Watch your condition.  Get  help right away if you are not doing well or if you get worse. Document Released: 03/23/2011 Document Revised: 10/02/2011 Document Reviewed: 03/23/2011 [facility] Patient Information 2015 [place], [place]. This information is not intended to replace advice given to you by your health care provider. Make sure you discuss any questions you have with your health care provider.

## 2020-03-23 NOTE — Progress Notes (Signed)
Pain medial left foot- tib ant insertion.  Club foot left-  No open sores,     HPI: Patient is 74 y.o. male who presents today for pain along the inside of the left foot.  He relates he had club foot as a child and has not had any problems with his foot until recently.  He relates he has been able to run and work on the foot without difficulty but now has pain.  He also states his left great toe gets a callused area but wearing a bandaid to protect the spot is helpful.    Patient Active Problem List   Diagnosis Date Noted  . Pain due to onychomycosis of toenails of both feet 01/30/2020  . Unilateral primary osteoarthritis, right knee 07/25/2016  . HYPERLIPIDEMIA 04/12/2009    Current Outpatient Medications on File Prior to Visit  Medication Sig Dispense Refill  . diphenhydrAMINE (BENADRYL) 25 mg capsule Take 25 mg by mouth every 6 (six) hours as needed.    . furosemide (LASIX) 20 MG tablet TAKE 1 TABLET(20 MG) BY MOUTH DAILY 90 tablet 0  . glucosamine-chondroitin 500-400 MG tablet Take 1 tablet by mouth daily.    Marland Kitchen ibuprofen (ADVIL,MOTRIN) 200 MG tablet Take 800 mg by mouth every 6 (six) hours as needed for moderate pain.    . Multiple Vitamin (MULTIVITAMIN WITH MINERALS) TABS tablet Take 1 tablet by mouth daily.    . Omega-3 Fatty Acids (FISH OIL) 1000 MG CAPS Take by mouth. Take One Capsule Daily.    . rosuvastatin (CRESTOR) 10 MG tablet Take 1 tablet (10 mg total) by mouth daily. 90 tablet 3   No current facility-administered medications on file prior to visit.    No Known Allergies  Review of Systems No fevers, chills, nausea, muscle aches, no difficulty breathing, no calf pain, no chest pain or shortness of breath.   Physical Exam  GENERAL APPEARANCE: Alert, conversant. Appropriately groomed. No acute distress.   VASCULAR: Pedal pulses palpable DP and PT bilateral.  Capillary refill time is immediate to all digits,  Proximal to distal cooling it warm to warm.  Digital  perfusion adequate.   NEUROLOGIC: sensation is intact to 5.07 monofilament at 5/5 sites bilateral.  Light touch is intact bilateral, vibratory sensation intact bilateral  MUSCULOSKELETAL: acceptable muscle strength, tone and stability bilateral. Evidence of a corrected clubfoot on the left is noted.  Pain along the tibialis anterior tendon and at its insertion site is noted with palpation.   DERMATOLOGIC: skin is warm, supple, and dry.  No open lesions noted.  No rash, no pre ulcerative lesions. Digital nails are asymptomatic.      Assessment     ICD-10-CM   1. Tendonitis of ankle or foot  M77.50   2. Soft tissue injury of left foot, initial encounter  [phone]      Plan  Discussed treatment options.  Recommended a steroid injection adjacent to the tendon to hopefully decrease the inflammation.  The patient agreed and a prep with alcohol was applied.  An injection consisting of 10mg  kenalog and 85ml 0.5% marcaine plain was inflitrated medially to the tibialis anterior tendon being careful to avoid the sheath or tendon itself.  Recommended an elastic wrap for support and to take it easy for the next few days. He will let me know if the injection doesn't help.  Otherwise he will be seen back prn.

## 2020-03-25 ENCOUNTER — Other Ambulatory Visit: Payer: Self-pay

## 2020-03-25 ENCOUNTER — Ambulatory Visit (INDEPENDENT_AMBULATORY_CARE_PROVIDER_SITE_OTHER): Payer: Medicare HMO

## 2020-03-25 ENCOUNTER — Ambulatory Visit: Payer: Medicare HMO | Admitting: Podiatry

## 2020-03-25 ENCOUNTER — Other Ambulatory Visit: Payer: Self-pay | Admitting: Podiatry

## 2020-03-25 ENCOUNTER — Encounter: Payer: Self-pay | Admitting: Podiatry

## 2020-03-25 VITALS — Temp 97.5°F

## 2020-03-25 DIAGNOSIS — M76822 Posterior tibial tendinitis, left leg: Secondary | ICD-10-CM

## 2020-03-25 DIAGNOSIS — L814 Other melanin hyperpigmentation: Secondary | ICD-10-CM | POA: Diagnosis not present

## 2020-03-25 DIAGNOSIS — L905 Scar conditions and fibrosis of skin: Secondary | ICD-10-CM | POA: Diagnosis not present

## 2020-03-25 DIAGNOSIS — L821 Other seborrheic keratosis: Secondary | ICD-10-CM | POA: Diagnosis not present

## 2020-03-25 DIAGNOSIS — L819 Disorder of pigmentation, unspecified: Secondary | ICD-10-CM | POA: Diagnosis not present

## 2020-03-25 DIAGNOSIS — L570 Actinic keratosis: Secondary | ICD-10-CM | POA: Diagnosis not present

## 2020-03-25 DIAGNOSIS — D229 Melanocytic nevi, unspecified: Secondary | ICD-10-CM | POA: Diagnosis not present

## 2020-03-25 DIAGNOSIS — I8393 Asymptomatic varicose veins of bilateral lower extremities: Secondary | ICD-10-CM | POA: Diagnosis not present

## 2020-03-25 DIAGNOSIS — Z85828 Personal history of other malignant neoplasm of skin: Secondary | ICD-10-CM | POA: Diagnosis not present

## 2020-03-25 DIAGNOSIS — D1801 Hemangioma of skin and subcutaneous tissue: Secondary | ICD-10-CM | POA: Diagnosis not present

## 2020-03-25 DIAGNOSIS — M79672 Pain in left foot: Secondary | ICD-10-CM

## 2020-03-30 NOTE — Progress Notes (Signed)
Subjective:   Patient ID: Hunter Collins, male   DOB: 74 y.o.   MRN: [id number]   HPI Patient states he has been here recently and had a cortisone injection but it feels like the problem is in a different area and he likes to be very active and do different sports and it inhibiting him   ROS      Objective:  Physical Exam  Neuro vascular status intact with inflammation of the posterior tibial insertion left with moderate flatfoot deformity and history of clubfoot deformity on that foot which she has always been able to compensate and while he no longer does marathons he still very active with golf and hiking     Assessment:  Posterior tibial tendinitis left with severe foot structural issues complicating factor     Plan:  H&P reviewed condition and explained causes.  Today I did a very careful injection at insertion posterior tib navicular 3 mg Dexasone 5 mg liken advised him on reduced activity and dispensed a fascial brace in order to lift up the medial side of the foot and take stress off it.  Advised on ice therapy shoe gear modifications and reappoint to recheck

## 2020-04-15 ENCOUNTER — Encounter: Payer: Self-pay | Admitting: Family Medicine

## 2020-04-16 ENCOUNTER — Encounter: Payer: Self-pay | Admitting: Family Medicine

## 2020-04-26 ENCOUNTER — Ambulatory Visit (INDEPENDENT_AMBULATORY_CARE_PROVIDER_SITE_OTHER): Payer: Medicare HMO | Admitting: Family Medicine

## 2020-04-26 ENCOUNTER — Encounter: Payer: Self-pay | Admitting: Family Medicine

## 2020-04-26 ENCOUNTER — Other Ambulatory Visit: Payer: Self-pay

## 2020-04-26 VITALS — BP 118/78 | HR 62 | Temp 98.2°F | Ht 67.5 in | Wt 186.8 lb

## 2020-04-26 DIAGNOSIS — R600 Localized edema: Secondary | ICD-10-CM

## 2020-04-26 DIAGNOSIS — Z23 Encounter for immunization: Secondary | ICD-10-CM | POA: Diagnosis not present

## 2020-04-26 DIAGNOSIS — E785 Hyperlipidemia, unspecified: Secondary | ICD-10-CM

## 2020-04-26 MED ORDER — ROSUVASTATIN CALCIUM 5 MG PO TABS: 5.0000 mg | ORAL_TABLET | Freq: Every day | ORAL | 3 refills | Status: DC

## 2020-04-26 NOTE — Progress Notes (Signed)
Established Patient Office Visit  Subjective:  Patient ID: Hunter Collins, male    DOB: [birth year]  Age: 74 y.o. MRN: [id number]  CC:  Chief Complaint  Patient presents with  . Follow-up    Doing well    HPI Hunter Collins presents for follow-up regarding bilateral leg edema left greater than right.  Refer to previous note.  We suspected predominantly venous stasis.  Symptoms were consistently worse late in the day.  He generally was logging 15-20,000 steps per day.  Working full-time.  We recommended frequent leg elevation, venous compression, avoidance of nonsteroidals and consideration for low-dose furosemide short-term.  He never took Lasix and never got the compression stockings.  His edema is improved and basically resolved at this time.  He did see podiatrist and had a couple of cortisone injections in his left foot and ankle.  No dyspnea.  No orthopnea.  Severe hyperlipidemia.  He started Crestor 10 mg daily last year.  Lipids were greatly improved.  He did not have any myalgias or arthralgias but had wondered if the edema was related to the Crestor.  He would like to consider starting back at a lower dosage.  Past Medical History:  Diagnosis Date  . Allergy   . Bradycardia    " MY HEART RATE IS USUALLY AROUD 50 - I EXERCISE A LOT"  . Inguinal hernia    RIGHT    Past Surgical History:  Procedure Laterality Date  . CLOSED REDUCTION ELBOW DISLOCATION     L ELBOW  . INGUINAL HERNIA REPAIR Right 08/13/2015   Procedure: LAPAROSCOPIC RIGHT INGUINAL HERNIA WITH MESH;  Surgeon: Coralie Keens, MD;  Location: [facility];  Service: General;  Laterality: Right;  . INSERTION OF MESH Right 08/13/2015   Procedure: INSERTION OF MESH;  Surgeon: Coralie Keens, MD;  Location: [facility];  Service: General;  Laterality: Right;  . KNEE ARTHROSCOPY  2002   RT KNEE  . TONSILLECTOMY AND ADENOIDECTOMY  1960    Family History  Problem Relation Age of Onset  . Heart attack Mother   . Pancreatic  cancer Brother   . Colon cancer Neg Hx     Social History   Socioeconomic History  . Marital status: Married    Spouse name: Not on file  . Number of children: Not on file  . Years of education: Not on file  . Highest education level: Not on file  Occupational History  . Not on file  Tobacco Use  . Smoking status: Never Smoker  . Smokeless tobacco: Current User    Types: Chew  Vaping Use  . Vaping Use: Never used  Substance and Sexual Activity  . Alcohol use: Yes    Alcohol/week: 3.0 standard drinks    Types: 3 Shots of liquor per week    Comment: weekends  . Drug use: No  . Sexual activity: Not on file  Other Topics Concern  . Not on file  Social History Narrative  . Not on file   Social Determinants of Health   Financial Resource Strain:   . Difficulty of Paying Living Expenses: Not on file  Food Insecurity:   . Worried About Charity fundraiser in the Last Year: Not on file  . Ran Out of Food in the Last Year: Not on file  Transportation Needs:   . Lack of Transportation (Medical): Not on file  . Lack of Transportation (Non-Medical): Not on file  Physical Activity:   . Days of  Exercise per Week: Not on file  . Minutes of Exercise per Session: Not on file  Stress:   . Feeling of Stress : Not on file  Social Connections:   . Frequency of Communication with Friends and Family: Not on file  . Frequency of Social Gatherings with Friends and Family: Not on file  . Attends Religious Services: Not on file  . Active Member of Clubs or Organizations: Not on file  . Attends Archivist Meetings: Not on file  . Marital Status: Not on file  Intimate Partner Violence:   . Fear of Current or Ex-Partner: Not on file  . Emotionally Abused: Not on file  . Physically Abused: Not on file  . Sexually Abused: Not on file    Outpatient Medications Prior to Visit  Medication Sig Dispense Refill  . diphenhydrAMINE (BENADRYL) 25 mg capsule Take 25 mg by mouth every 6  (six) hours as needed.    Marland Kitchen glucosamine-chondroitin 500-400 MG tablet Take 1 tablet by mouth daily.    Marland Kitchen ibuprofen (ADVIL,MOTRIN) 200 MG tablet Take 800 mg by mouth every 6 (six) hours as needed for moderate pain.    . Multiple Vitamin (MULTIVITAMIN WITH MINERALS) TABS tablet Take 1 tablet by mouth daily.    . Omega-3 Fatty Acids (FISH OIL) 1000 MG CAPS Take by mouth. Take One Capsule Daily.    . furosemide (LASIX) 20 MG tablet TAKE 1 TABLET(20 MG) BY MOUTH DAILY 90 tablet 0  . rosuvastatin (CRESTOR) 10 MG tablet Take 1 tablet (10 mg total) by mouth daily. (Patient not taking: Reported on 04/26/2020) 90 tablet 3   No facility-administered medications prior to visit.    No Known Allergies  ROS Review of Systems  Constitutional: Negative for fatigue and unexpected weight change.  Eyes: Negative for visual disturbance.  Respiratory: Negative for cough, chest tightness and shortness of breath.   Cardiovascular: Negative for chest pain, palpitations and leg swelling.  Endocrine: Negative for polydipsia and polyuria.  Neurological: Negative for dizziness, syncope, weakness, light-headedness and headaches.      Objective:    Physical Exam Vitals reviewed.  Constitutional:      Appearance: Normal appearance.  Cardiovascular:     Rate and Rhythm: Normal rate and regular rhythm.  Pulmonary:     Effort: Pulmonary effort is normal.     Breath sounds: Normal breath sounds.  Musculoskeletal:     Right lower leg: No edema.     Left lower leg: No edema.  Neurological:     Mental Status: He is alert.     BP 118/78   Pulse 62   Temp 98.2 F (36.8 C) (Oral)   Ht 5' 7.5" (1.715 m)   Wt 186 lb 12.8 oz (84.7 kg)   SpO2 97%   BMI 28.83 kg/m  Wt Readings from Last 3 Encounters:  04/26/20 186 lb 12.8 oz (84.7 kg)  07/23/19 184 lb 3.2 oz (83.6 kg)  09/17/18 180 lb 14.4 oz (82.1 kg)     There are no preventive care reminders to display for this patient.  There are no preventive care  reminders to display for this patient.  Lab Results  Component Value Date   TSH 2.01 07/23/2019   Lab Results  Component Value Date   WBC 4.4 07/23/2019   HGB 14.8 07/23/2019   HCT 44.0 07/23/2019   MCV 98.0 07/23/2019   PLT 194.0 07/23/2019   Lab Results  Component Value Date   NA 137 07/23/2019  K 4.9 07/23/2019   CO2 27 07/23/2019   GLUCOSE 88 07/23/2019   BUN 23 07/23/2019   CREATININE 0.97 07/23/2019   BILITOT 0.8 11/13/2019   ALKPHOS 55 11/13/2019   AST 19 11/13/2019   ALT 21 11/13/2019   PROT 6.7 11/13/2019   ALBUMIN 4.4 11/13/2019   CALCIUM 9.8 07/23/2019   GFR 75.28 07/23/2019   Lab Results  Component Value Date   CHOL 177 11/13/2019   Lab Results  Component Value Date   HDL 48.30 11/13/2019   Lab Results  Component Value Date   LDLCALC 108 (H) 11/13/2019   Lab Results  Component Value Date   TRIG 105.0 11/13/2019   Lab Results  Component Value Date   CHOLHDL 4 11/13/2019   No results found for: HGBA1C    Assessment & Plan:   #1 bilateral leg edema essentially resolved. -Observe for now.  Be in touch if this recurs.  #2 dyslipidemia.  Patient currently not taking statin -We agreed to trial of Crestor 5 mg daily and future lab order for lipid and hepatic written to return in 1 month for that  -Flu vaccine given  Meds ordered this encounter  Medications  . rosuvastatin (CRESTOR) 5 MG tablet    Sig: Take 1 tablet (5 mg total) by mouth daily.    Dispense:  90 tablet    Refill:  3    Follow-up: No follow-ups on file.    Carolann Littler, MD

## 2020-04-29 ENCOUNTER — Other Ambulatory Visit: Payer: Self-pay | Admitting: Family Medicine

## 2020-04-30 NOTE — Telephone Encounter (Signed)
Rx done. 

## 2020-04-30 NOTE — Telephone Encounter (Signed)
Refill once 

## 2020-05-24 ENCOUNTER — Telehealth: Payer: Self-pay | Admitting: Family Medicine

## 2020-05-24 NOTE — Telephone Encounter (Signed)
Left message for patient to call back and schedule Medicare Annual Wellness Visit (AWV) either virtually or in office.  Last AWV 10/08/02015  Please schedule at anytime with [facility] Nurse Health Advisor 1. This should be a 45 minute visit.

## 2020-05-31 ENCOUNTER — Other Ambulatory Visit: Payer: Self-pay

## 2020-05-31 ENCOUNTER — Other Ambulatory Visit: Payer: Medicare HMO

## 2020-05-31 DIAGNOSIS — E785 Hyperlipidemia, unspecified: Secondary | ICD-10-CM

## 2020-06-01 LAB — HEPATIC FUNCTION PANEL
AG Ratio: 1.8 (calc) (ref 1.0–2.5)
ALT: 21 U/L (ref 9–46)
AST: 20 U/L (ref 10–35)
Albumin: 4.3 g/dL (ref 3.6–5.1)
Alkaline phosphatase (APISO): 60 U/L (ref 35–144)
Bilirubin, Direct: 0.2 mg/dL (ref 0.0–0.2)
Globulin: 2.4 g/dL (calc) (ref 1.9–3.7)
Indirect Bilirubin: 0.6 mg/dL (calc) (ref 0.2–1.2)
Total Bilirubin: 0.8 mg/dL (ref 0.2–1.2)
Total Protein: 6.7 g/dL (ref 6.1–8.1)

## 2020-06-01 LAB — LIPID PANEL
Cholesterol: 212 mg/dL — ABNORMAL HIGH (ref <200)
HDL: 47 mg/dL (ref >=40)
LDL Cholesterol (Calc): 141 mg/dL (calc) — ABNORMAL HIGH
Non-HDL Cholesterol (Calc): 165 mg/dL (calc) — ABNORMAL HIGH (ref <130)
Total CHOL/HDL Ratio: 4.5 (calc) (ref <5.0)
Triglycerides: 118 mg/dL (ref <150)

## 2020-06-04 ENCOUNTER — Ambulatory Visit: Payer: Medicare HMO | Admitting: Podiatry

## 2020-06-04 ENCOUNTER — Other Ambulatory Visit: Payer: Self-pay

## 2020-06-04 ENCOUNTER — Encounter: Payer: Self-pay | Admitting: Podiatry

## 2020-06-04 DIAGNOSIS — M79674 Pain in right toe(s): Secondary | ICD-10-CM | POA: Diagnosis not present

## 2020-06-04 DIAGNOSIS — M2041 Other hammer toe(s) (acquired), right foot: Secondary | ICD-10-CM

## 2020-06-04 DIAGNOSIS — M79675 Pain in left toe(s): Secondary | ICD-10-CM

## 2020-06-04 DIAGNOSIS — B351 Tinea unguium: Secondary | ICD-10-CM | POA: Diagnosis not present

## 2020-06-04 NOTE — Progress Notes (Signed)
This patient returns to the office for evaluation and treatment of long thick painful nails .  This patient is unable to trim his own nails since the patient cannot reach his feet.  Patient says the nails are painful walking and wearing his shoes.  He returns for preventive foot care services.  General Appearance  Alert, conversant and in no acute stress.  Vascular  Dorsalis pedis and posterior tibial  pulses are palpable  bilaterally.  Capillary return is within normal limits  bilaterally. Temperature is within normal limits  bilaterally.  Neurologic  Senn-Weinstein monofilament wire test within normal limits  bilaterally. Muscle power within normal limits bilaterally.  Nails Thick disfigured discolored nails with subungual debris second toenail  B/L and third toenail right foot.. No evidence of bacterial infection or drainage bilaterally.  Orthopedic  No limitations of motion  feet .  No crepitus or effusions noted.  No bony pathology or digital deformities noted.  Skin  normotropic skin with no porokeratosis noted bilaterally.  No signs of infections or ulcers noted.   Heloma durum fifth toe right foot.  Onychomycosis  Pain in toes right foot  Pain in toes left foot  Debridement  of nails   B/L with a nail nipper.  Nails were then filed using a dremel tool with no incidents.    RTC  prn   Gardiner Barefoot DPM

## 2020-06-07 ENCOUNTER — Encounter: Payer: Self-pay | Admitting: Family Medicine

## 2020-06-07 MED ORDER — XIIDRA 5 % OP SOLN: OPHTHALMIC | 3 refills | Status: DC

## 2020-06-07 NOTE — Telephone Encounter (Signed)
I sent in prescription to try this.

## 2020-06-08 ENCOUNTER — Encounter: Payer: Self-pay | Admitting: Family Medicine

## 2020-06-09 ENCOUNTER — Telehealth: Payer: Self-pay

## 2020-06-09 MED ORDER — RESTASIS 0.05 % OP EMUL: 1.0000 [drp] | Freq: Two times a day (BID) | OPHTHALMIC | 11 refills | Status: DC

## 2020-06-09 NOTE — Telephone Encounter (Signed)
Mychart message sent to patient to see if he is willing to try Restasis.

## 2020-06-09 NOTE — Telephone Encounter (Signed)
Hunter Collins is not covered by patients plan.  Preferred alternative is Restatis.  Please advise if change is appropriate or if prior authorization is necessary.

## 2020-06-09 NOTE — Telephone Encounter (Signed)
restasis sent

## 2020-06-09 NOTE — Telephone Encounter (Signed)
If he is willing, would try Restasis.

## 2020-07-27 ENCOUNTER — Ambulatory Visit (INDEPENDENT_AMBULATORY_CARE_PROVIDER_SITE_OTHER): Payer: Medicare HMO | Admitting: Family Medicine

## 2020-07-27 ENCOUNTER — Other Ambulatory Visit: Payer: Self-pay

## 2020-07-27 ENCOUNTER — Encounter: Payer: Medicare HMO | Admitting: Family Medicine

## 2020-07-27 ENCOUNTER — Encounter: Payer: Self-pay | Admitting: Family Medicine

## 2020-07-27 VITALS — BP 130/84 | HR 64 | Ht 67.5 in | Wt 194.0 lb

## 2020-07-27 DIAGNOSIS — Z0000 Encounter for general adult medical examination without abnormal findings: Secondary | ICD-10-CM | POA: Diagnosis not present

## 2020-07-27 NOTE — Progress Notes (Signed)
Established Patient Office Visit  Subjective:  Patient ID: Hunter Collins, male    DOB: Jul 14, 1946  Age: 75 y.o. MRN: [id number]  CC:  Chief Complaint  Patient presents with  . Annual Exam    HPI Hunter Collins presents for physical exam.  Generally very healthy.  He has had some weight gain over the past couple years about 20 pounds.  He attributes this to increased snacking.  He is very active and walks at least 5 to 6 miles per day.  He continues to work full-time at [employer] insecurity he states he walks 5 miles per day with that and also walks his dog about 45 minutes to an hour per day in addition.  He has had some osteoarthritis.  Hyperlipidemia treated with low-dose Crestor.  Health maintenance reviewed  -Flu vaccine already given -Covid vaccines up-to-date -Pneumonia vaccines complete -Hepatitis C screen previously negative -Tetanus due 2029 -Colonoscopy due 3/22.  He is not sure if he will go through with this -Had previous Zostavax 2015  Social history-married.  Works at [employer] insecurity.  Non-smoker.  Only occasional alcohol use.  Family history-had a brother that died of pancreatic cancer.  His mom had heart disease.  No family history of diabetes.  Past Medical History:  Diagnosis Date  . Allergy   . Bradycardia    " MY HEART RATE IS USUALLY AROUD 50 - I EXERCISE A LOT"  . Inguinal hernia    RIGHT    Past Surgical History:  Procedure Laterality Date  . CLOSED REDUCTION ELBOW DISLOCATION     L ELBOW  . INGUINAL HERNIA REPAIR Right 08/13/2015   Procedure: LAPAROSCOPIC RIGHT INGUINAL HERNIA WITH MESH;  Surgeon: Coralie Keens, MD;  Location: [facility];  Service: General;  Laterality: Right;  . INSERTION OF MESH Right 08/13/2015   Procedure: INSERTION OF MESH;  Surgeon: Coralie Keens, MD;  Location: [facility];  Service: General;  Laterality: Right;  . KNEE ARTHROSCOPY  2002   RT KNEE  . TONSILLECTOMY AND ADENOIDECTOMY  1960    Family  History  Problem Relation Age of Onset  . Heart attack Mother   . Pancreatic cancer Brother   . Colon cancer Neg Hx     Social History   Socioeconomic History  . Marital status: Married    Spouse name: Not on file  . Number of children: Not on file  . Years of education: Not on file  . Highest education level: Not on file  Occupational History  . Not on file  Tobacco Use  . Smoking status: Never Smoker  . Smokeless tobacco: Current User    Types: Chew  Vaping Use  . Vaping Use: Never used  Substance and Sexual Activity  . Alcohol use: Yes    Alcohol/week: 3.0 standard drinks    Types: 3 Shots of liquor per week    Comment: weekends  . Drug use: No  . Sexual activity: Not on file  Other Topics Concern  . Not on file  Social History Narrative  . Not on file   Social Determinants of Health   Financial Resource Strain: Not on file  Food Insecurity: Not on file  Transportation Needs: Not on file  Physical Activity: Not on file  Stress: Not on file  Social Connections: Not on file  Intimate Partner Violence: Not on file    Outpatient Medications Prior to Visit  Medication Sig Dispense Refill  . cycloSPORINE (RESTASIS) 0.05 %  ophthalmic emulsion Place 1 drop into both eyes 2 (two) times daily. 5.5 mL 11  . diphenhydrAMINE (BENADRYL) 25 mg capsule Take 25 mg by mouth every 6 (six) hours as needed.    . furosemide (LASIX) 20 MG tablet TAKE 1 TABLET(20 MG) BY MOUTH DAILY 90 tablet 0  . glucosamine-chondroitin 500-400 MG tablet Take 1 tablet by mouth daily.    Marland Kitchen ibuprofen (ADVIL,MOTRIN) 200 MG tablet Take 800 mg by mouth every 6 (six) hours as needed for moderate pain.    . Multiple Vitamin (MULTIVITAMIN WITH MINERALS) TABS tablet Take 1 tablet by mouth daily.    . Omega-3 Fatty Acids (FISH OIL) 1000 MG CAPS Take by mouth. Take One Capsule Daily.    . rosuvastatin (CRESTOR) 5 MG tablet Take 1 tablet (5 mg total) by mouth daily. 90 tablet 3   No facility-administered  medications prior to visit.    No Known Allergies  ROS Review of Systems  Constitutional: Negative for activity change, appetite change, fatigue and fever.  HENT: Negative for congestion, ear pain and trouble swallowing.   Eyes: Negative for pain and visual disturbance.  Respiratory: Negative for cough, shortness of breath and wheezing.   Cardiovascular: Negative for chest pain and palpitations.  Gastrointestinal: Negative for abdominal distention, abdominal pain, blood in stool, constipation, diarrhea, nausea, rectal pain and vomiting.  Endocrine: Negative for polydipsia and polyuria.  Genitourinary: Negative for dysuria, hematuria and testicular pain.       Does have some nocturia but no slow stream  Musculoskeletal: Negative for arthralgias and joint swelling.  Skin: Negative for rash.  Neurological: Negative for dizziness, syncope and headaches.  Hematological: Negative for adenopathy.  Psychiatric/Behavioral: Negative for confusion and dysphoric mood.      Objective:    Physical Exam Vitals reviewed.  Constitutional:      Appearance: Normal appearance.  HENT:     Right Ear: Ear canal normal.     Left Ear: Ear canal normal.  Cardiovascular:     Rate and Rhythm: Normal rate and regular rhythm.  Pulmonary:     Effort: Pulmonary effort is normal.     Breath sounds: Normal breath sounds.  Musculoskeletal:     Cervical back: Neck supple.     Comments: Trace edema lower legs bilaterally  Lymphadenopathy:     Cervical: No cervical adenopathy.  Neurological:     General: No focal deficit present.     Mental Status: He is alert.     Cranial Nerves: No cranial nerve deficit.     BP 130/84   Pulse 64   Ht 5' 7.5" (1.715 m)   Wt 194 lb (88 kg)   SpO2 97%   BMI 29.94 kg/m  Wt Readings from Last 3 Encounters:  07/27/20 194 lb (88 kg)  04/26/20 186 lb 12.8 oz (84.7 kg)  07/23/19 184 lb 3.2 oz (83.6 kg)     There are no preventive care reminders to display for this  patient.  There are no preventive care reminders to display for this patient.  Lab Results  Component Value Date   TSH 2.01 07/23/2019   Lab Results  Component Value Date   WBC 4.4 07/23/2019   HGB 14.8 07/23/2019   HCT 44.0 07/23/2019   MCV 98.0 07/23/2019   PLT 194.0 07/23/2019   Lab Results  Component Value Date   NA 137 07/23/2019   K 4.9 07/23/2019   CO2 27 07/23/2019   GLUCOSE 88 07/23/2019   BUN 23 07/23/2019  CREATININE 0.97 07/23/2019   BILITOT 0.8 05/31/2020   ALKPHOS 55 11/13/2019   AST 20 05/31/2020   ALT 21 05/31/2020   PROT 6.7 05/31/2020   ALBUMIN 4.4 11/13/2019   CALCIUM 9.8 07/23/2019   GFR 75.28 07/23/2019   Lab Results  Component Value Date   CHOL 212 (H) 05/31/2020   Lab Results  Component Value Date   HDL 47 05/31/2020   Lab Results  Component Value Date   LDLCALC 141 (H) 05/31/2020   Lab Results  Component Value Date   TRIG 118 05/31/2020   Lab Results  Component Value Date   CHOLHDL 4.5 05/31/2020   No results found for: HGBA1C    Assessment & Plan:   Problem List Items Addressed This Visit   None   Visit Diagnoses    Physical exam    -  Primary    He has had some weight gain as above and we discussed strategies for losing.  He already has a good baseline of activity but needs to tighten up his diet.  He would like to try this and then follow-up in 4 months to reassess.  We decided not to get a labs today since he had recent lipids and hepatic panel back in November.  He is up-to-date with vaccinations.  He is undecided whether to pursue repeat colonoscopy this spring  No orders of the defined types were placed in this encounter.   Follow-up: Return in about 4 months (around 11/24/2020).    Carolann Littler, MD

## 2020-07-27 NOTE — Patient Instructions (Signed)

## 2020-08-08 ENCOUNTER — Encounter: Payer: Self-pay | Admitting: Family Medicine

## 2020-08-10 ENCOUNTER — Encounter: Payer: Self-pay | Admitting: Family Medicine

## 2020-08-10 NOTE — Telephone Encounter (Signed)
I spoke with patient.  His symptoms started Saturday.  His test came back positive yesterday.  He is already feeling better and in fact had already gone back to work yesterday before testing.  He is not interested in referral at this time as he feels much better.  He has had vaccine including booster.  Keeping down fluids well.  No dyspnea.  He is aware to isolate for minimum of 5 days and then strict mask use for another 5 days assuming he is asymptomatic.  His cough has almost resolved already.

## 2020-09-13 ENCOUNTER — Other Ambulatory Visit: Payer: Self-pay | Admitting: Family Medicine

## 2020-10-01 ENCOUNTER — Other Ambulatory Visit: Payer: Self-pay

## 2020-10-01 ENCOUNTER — Ambulatory Visit: Payer: Medicare HMO | Admitting: Podiatry

## 2020-10-01 ENCOUNTER — Encounter: Payer: Self-pay | Admitting: Podiatry

## 2020-10-01 DIAGNOSIS — M2041 Other hammer toe(s) (acquired), right foot: Secondary | ICD-10-CM | POA: Diagnosis not present

## 2020-10-01 DIAGNOSIS — L84 Corns and callosities: Secondary | ICD-10-CM

## 2020-10-01 NOTE — Progress Notes (Signed)
This patient returns to the office for evaluation and treatment of thick painful corn on the fifth toe right foot..  This patient is unable to trim his corn since the patient cannot reach his feet.  Patient says the corn is  painful walking and wearing his shoes.  He returns for preventive foot care services.  General Appearance  Alert, conversant and in no acute stress.  Vascular  Dorsalis pedis and posterior tibial  pulses are palpable  bilaterally.  Capillary return is within normal limits  bilaterally. Temperature is within normal limits  bilaterally.  Neurologic  Senn-Weinstein monofilament wire test within normal limits  bilaterally. Muscle power within normal limits bilaterally.  Nails Thick disfigured discolored nails with subungual debris second toenail  B/L and third toenail right foot.. No evidence of bacterial infection or drainage bilaterally.  Orthopedic  No limitations of motion  feet .  No crepitus or effusions noted.  No bony pathology or digital deformities noted.  Skin  normotropic skin with no porokeratosis noted bilaterally.  No signs of infections or ulcers noted.   Heloma durum fifth toe right foot.  Heloma durum fifth toe right foot.    Debridement  Of heloma durum fifth toe right foot.  RTC 3 months    Gardiner Barefoot DPM

## 2020-10-11 DIAGNOSIS — H35363 Drusen (degenerative) of macula, bilateral: Secondary | ICD-10-CM | POA: Diagnosis not present

## 2020-10-11 DIAGNOSIS — H04123 Dry eye syndrome of bilateral lacrimal glands: Secondary | ICD-10-CM | POA: Diagnosis not present

## 2020-10-11 DIAGNOSIS — H2513 Age-related nuclear cataract, bilateral: Secondary | ICD-10-CM | POA: Diagnosis not present

## 2020-10-11 DIAGNOSIS — H25013 Cortical age-related cataract, bilateral: Secondary | ICD-10-CM | POA: Diagnosis not present

## 2020-10-11 DIAGNOSIS — H2511 Age-related nuclear cataract, right eye: Secondary | ICD-10-CM | POA: Diagnosis not present

## 2020-10-19 ENCOUNTER — Encounter: Payer: Self-pay | Admitting: Family Medicine

## 2020-10-19 DIAGNOSIS — E785 Hyperlipidemia, unspecified: Secondary | ICD-10-CM

## 2020-10-19 HISTORY — PX: CATARACT EXTRACTION: SUR2

## 2020-10-20 ENCOUNTER — Encounter: Payer: Self-pay | Admitting: Family Medicine

## 2020-10-20 DIAGNOSIS — H25811 Combined forms of age-related cataract, right eye: Secondary | ICD-10-CM | POA: Diagnosis not present

## 2020-10-20 DIAGNOSIS — H2511 Age-related nuclear cataract, right eye: Secondary | ICD-10-CM | POA: Diagnosis not present

## 2020-10-22 ENCOUNTER — Other Ambulatory Visit: Payer: Self-pay

## 2020-10-22 ENCOUNTER — Other Ambulatory Visit (INDEPENDENT_AMBULATORY_CARE_PROVIDER_SITE_OTHER): Payer: Medicare HMO

## 2020-10-22 DIAGNOSIS — E785 Hyperlipidemia, unspecified: Secondary | ICD-10-CM

## 2020-10-22 LAB — LIPID PANEL
Cholesterol: 289 mg/dL — ABNORMAL HIGH (ref 0–200)
HDL: 51.7 mg/dL (ref >39.00)
LDL Cholesterol: 219 mg/dL — ABNORMAL HIGH (ref 0–99)
NonHDL: 237.77
Total CHOL/HDL Ratio: 6
Triglycerides: 92 mg/dL (ref 0.0–149.0)
VLDL: 18.4 mg/dL (ref 0.0–40.0)

## 2020-10-25 ENCOUNTER — Other Ambulatory Visit: Payer: Self-pay

## 2020-10-25 ENCOUNTER — Encounter: Payer: Self-pay | Admitting: Family Medicine

## 2020-10-25 ENCOUNTER — Ambulatory Visit (INDEPENDENT_AMBULATORY_CARE_PROVIDER_SITE_OTHER): Payer: Medicare HMO | Admitting: Family Medicine

## 2020-10-25 VITALS — BP 124/64 | HR 55 | Temp 97.9°F | Wt 179.2 lb

## 2020-10-25 DIAGNOSIS — E785 Hyperlipidemia, unspecified: Secondary | ICD-10-CM | POA: Diagnosis not present

## 2020-10-25 MED ORDER — ROSUVASTATIN CALCIUM 10 MG PO TABS
10.0000 mg | ORAL_TABLET | Freq: Every day | ORAL | 3 refills | Status: DC
Start: 2020-10-25 — End: 2021-04-21

## 2020-10-25 NOTE — Patient Instructions (Signed)
High Cholesterol  High cholesterol is a condition in which the blood has high levels of a white, waxy substance similar to fat (cholesterol). The liver makes all the cholesterol that the body needs. The human body needs small amounts of cholesterol to help build cells. A person gets extra or excess cholesterol from the food that he or she eats. The blood carries cholesterol from the liver to the rest of the body. If you have high cholesterol, deposits (plaques) may build up on the walls of your arteries. Arteries are the blood vessels that carry blood away from your heart. These plaques make the arteries narrow and stiff. Cholesterol plaques increase your risk for heart attack and stroke. Work with your health care provider to keep your cholesterol levels in a healthy range. What increases the risk? The following factors may make you more likely to develop this condition:  Eating foods that are high in animal fat (saturated fat) or cholesterol.  Being overweight.  Not getting enough exercise.  A family history of high cholesterol (familial hypercholesterolemia).  Use of tobacco products.  Having diabetes. What are the signs or symptoms? There are no symptoms of this condition. How is this diagnosed? This condition may be diagnosed based on the results of a blood test.  If you are older than 77 years of age, your health care provider may check your cholesterol levels every 4-6 years.  You may be checked more often if you have high cholesterol or other risk factors for heart disease. The blood test for cholesterol measures:  "Bad" cholesterol, or LDL cholesterol. This is the main type of cholesterol that causes heart disease. The desired level is less than 100 mg/dL.  "Good" cholesterol, or HDL cholesterol. HDL helps protect against heart disease by cleaning the arteries and carrying the LDL to the liver for processing. The desired level for HDL is 60 mg/dL or higher.  Triglycerides.  These are fats that your body can store or burn for energy. The desired level is less than 150 mg/dL.  Total cholesterol. This measures the total amount of cholesterol in your blood and includes LDL, HDL, and triglycerides. The desired level is less than 200 mg/dL. How is this treated? This condition may be treated with:  Diet changes. You may be asked to eat foods that have more fiber and less saturated fats or added sugar.  Lifestyle changes. These may include regular exercise, maintaining a healthy weight, and quitting use of tobacco products.  Medicines. These are given when diet and lifestyle changes have not worked. You may be prescribed a statin medicine to help lower your cholesterol levels. Follow these instructions at home: Eating and drinking  Eat a healthy, balanced diet. This diet includes: ? Daily servings of a variety of fresh, frozen, or canned fruits and vegetables. ? Daily servings of whole grain foods that are rich in fiber. ? Foods that are low in saturated fats and trans fats. These include poultry and fish without skin, lean cuts of meat, and low-fat dairy products. ? A variety of fish, especially oily fish that contain omega-3 fatty acids. Aim to eat fish at least 2 times a week.  Avoid foods and drinks that have added sugar.  Use healthy cooking methods, such as roasting, grilling, broiling, baking, poaching, steaming, and stir-frying. Do not fry your food except for stir-frying.   Lifestyle  Get regular exercise. Aim to exercise for a total of 150 minutes a week. Increase your activity level by doing activities   such as gardening, walking, and taking the stairs.  Do not use any products that contain nicotine or tobacco, such as cigarettes, e-cigarettes, and chewing tobacco. If you need help quitting, ask your health care provider.   General instructions  Take over-the-counter and prescription medicines only as told by your health care provider.  Keep all  follow-up visits as told by your health care provider. This is important. Where to find more information  American Heart Association: www.heart.org  National Heart, Lung, and Blood Institute: www.nhlbi.nih.gov Contact a health care provider if:  You have trouble achieving or maintaining a healthy diet or weight.  You are starting an exercise program.  You are unable to stop smoking. Get help right away if:  You have chest pain.  You have trouble breathing.  You have any symptoms of a stroke. "BE FAST" is an easy way to remember the main warning signs of a stroke: ? B - Balance. Signs are dizziness, sudden trouble walking, or loss of balance. ? E - Eyes. Signs are trouble seeing or a sudden change in vision. ? F - Face. Signs are sudden weakness or numbness of the face, or the face or eyelid drooping on one side. ? A - Arms. Signs are weakness or numbness in an arm. This happens suddenly and usually on one side of the body. ? S - Speech. Signs are sudden trouble speaking, slurred speech, or trouble understanding what people say. ? T - Time. Time to call emergency services. Write down what time symptoms started.  You have other signs of a stroke, such as: ? A sudden, severe headache with no known cause. ? Nausea or vomiting. ? Seizure. These symptoms may represent a serious problem that is an emergency. Do not wait to see if the symptoms will go away. Get medical help right away. Call your local emergency services (911 in the [place] Do not drive yourself to the hospital. Summary  Cholesterol plaques increase your risk for heart attack and stroke. Work with your health care provider to keep your cholesterol levels in a healthy range.  Eat a healthy, balanced diet, get regular exercise, and maintain a healthy weight.  Do not use any products that contain nicotine or tobacco, such as cigarettes, e-cigarettes, and chewing tobacco.  Get help right away if you have any symptoms of a  stroke. This information is not intended to replace advice given to you by your health care provider. Make sure you discuss any questions you have with your health care provider. Document Revised: 06/09/2019 Document Reviewed: 06/09/2019 Elsevier Patient Education  2021 Elsevier Inc.  

## 2020-10-25 NOTE — Progress Notes (Signed)
Established Patient Office Visit  Subjective:  Patient ID: Hunter Collins, male    DOB: 12/14/1945  Age: 75 y.o. MRN: [id number]  CC:  Chief Complaint  Patient presents with  . Follow-up    Weight management     HPI Hunter Collins presents for follow-up from his physical back in January.  He had gained about 20 pounds over the past couple years mostly due to snacking.  Very good level of exercise with walking frequently 5 to 6 miles per day.  He restricted carbohydrate intake and has lost 15 pounds and feels great overall.  Unfortunately, his lipids did not seem to respond.  Recent cholesterol 289 with HDL 51 and LDL 219 with triglycerides of 92.  Currently on Crestor 5 mg daily.  We had placed him on 10 mg that he had some increased edema which he attributed to the Crestor.  No myalgias.  Past Medical History:  Diagnosis Date  . Allergy   . Bradycardia    " MY HEART RATE IS USUALLY AROUD 50 - I EXERCISE A LOT"  . Inguinal hernia    RIGHT    Past Surgical History:  Procedure Laterality Date  . CATARACT EXTRACTION  10/19/2020  . CLOSED REDUCTION ELBOW DISLOCATION     L ELBOW  . INGUINAL HERNIA REPAIR Right 08/13/2015   Procedure: LAPAROSCOPIC RIGHT INGUINAL HERNIA WITH MESH;  Surgeon: Coralie Keens, MD;  Location: [facility];  Service: General;  Laterality: Right;  . INSERTION OF MESH Right 08/13/2015   Procedure: INSERTION OF MESH;  Surgeon: Coralie Keens, MD;  Location: [facility];  Service: General;  Laterality: Right;  . KNEE ARTHROSCOPY  2002   RT KNEE  . TONSILLECTOMY AND ADENOIDECTOMY  1960    Family History  Problem Relation Age of Onset  . Heart attack Mother   . Pancreatic cancer Brother   . Colon cancer Neg Hx     Social History   Socioeconomic History  . Marital status: Married    Spouse name: Not on file  . Number of children: Not on file  . Years of education: Not on file  . Highest education level: Not on file  Occupational History  . Not on file   Tobacco Use  . Smoking status: Never Smoker  . Smokeless tobacco: Current User    Types: Chew  Vaping Use  . Vaping Use: Never used  Substance and Sexual Activity  . Alcohol use: Yes    Alcohol/week: 3.0 standard drinks    Types: 3 Shots of liquor per week    Comment: weekends  . Drug use: No  . Sexual activity: Not on file  Other Topics Concern  . Not on file  Social History Narrative  . Not on file   Social Determinants of Health   Financial Resource Strain: Not on file  Food Insecurity: Not on file  Transportation Needs: Not on file  Physical Activity: Not on file  Stress: Not on file  Social Connections: Not on file  Intimate Partner Violence: Not on file    Outpatient Medications Prior to Visit  Medication Sig Dispense Refill  . cycloSPORINE (RESTASIS) 0.05 % ophthalmic emulsion Place 1 drop into both eyes 2 (two) times daily. 5.5 mL 11  . diphenhydrAMINE (BENADRYL) 25 mg capsule Take 25 mg by mouth every 6 (six) hours as needed.    . furosemide (LASIX) 20 MG tablet TAKE 1 TABLET(20 MG) BY MOUTH DAILY 90 tablet 0  . glucosamine-chondroitin 500-400  MG tablet Take 1 tablet by mouth daily.    Marland Kitchen ibuprofen (ADVIL,MOTRIN) 200 MG tablet Take 800 mg by mouth every 6 (six) hours as needed for moderate pain.    . Multiple Vitamin (MULTIVITAMIN WITH MINERALS) TABS tablet Take 1 tablet by mouth daily.    . Omega-3 Fatty Acids (FISH OIL) 1000 MG CAPS Take by mouth. Take One Capsule Daily.    . rosuvastatin (CRESTOR) 10 MG tablet Take 10 mg by mouth daily.    . rosuvastatin (CRESTOR) 5 MG tablet Take 1 tablet (5 mg total) by mouth daily. 90 tablet 3   No facility-administered medications prior to visit.    No Known Allergies  ROS Review of Systems  Constitutional: Negative for fatigue and unexpected weight change.  Eyes: Negative for visual disturbance.  Respiratory: Negative for cough, chest tightness and shortness of breath.   Cardiovascular: Negative for chest pain,  palpitations and leg swelling.  Musculoskeletal: Negative for myalgias.  Neurological: Negative for dizziness, syncope, weakness, light-headedness and headaches.      Objective:    Physical Exam Vitals reviewed.  Constitutional:      Appearance: Normal appearance.  Cardiovascular:     Rate and Rhythm: Normal rate and regular rhythm.  Pulmonary:     Effort: Pulmonary effort is normal.     Breath sounds: Normal breath sounds.  Neurological:     Mental Status: He is alert.     BP 124/64 (BP Location: Left Arm, Patient Position: Sitting, Cuff Size: Normal)   Pulse (!) 55   Temp 97.9 F (36.6 C) (Oral)   Wt 179 lb 3.2 oz (81.3 kg)   SpO2 99%   BMI 27.65 kg/m  Wt Readings from Last 3 Encounters:  10/25/20 179 lb 3.2 oz (81.3 kg)  07/27/20 194 lb (88 kg)  04/26/20 186 lb 12.8 oz (84.7 kg)     Health Maintenance Due  Topic Date Due  . COLONOSCOPY (Pts 45-57yrs Insurance coverage will need to be confirmed)  10/14/2020    There are no preventive care reminders to display for this patient.  Lab Results  Component Value Date   TSH 2.01 07/23/2019   Lab Results  Component Value Date   WBC 4.4 07/23/2019   HGB 14.8 07/23/2019   HCT 44.0 07/23/2019   MCV 98.0 07/23/2019   PLT 194.0 07/23/2019   Lab Results  Component Value Date   NA 137 07/23/2019   K 4.9 07/23/2019   CO2 27 07/23/2019   GLUCOSE 88 07/23/2019   BUN 23 07/23/2019   CREATININE 0.97 07/23/2019   BILITOT 0.8 05/31/2020   ALKPHOS 55 11/13/2019   AST 20 05/31/2020   ALT 21 05/31/2020   PROT 6.7 05/31/2020   ALBUMIN 4.4 11/13/2019   CALCIUM 9.8 07/23/2019   GFR 75.28 07/23/2019   Lab Results  Component Value Date   CHOL 289 (H) 10/22/2020   Lab Results  Component Value Date   HDL 51.70 10/22/2020   Lab Results  Component Value Date   LDLCALC 219 (H) 10/22/2020   Lab Results  Component Value Date   TRIG 92.0 10/22/2020   Lab Results  Component Value Date   CHOLHDL 6 10/22/2020    No results found for: HGBA1C    Assessment & Plan:   Problem List Items Addressed This Visit   None   Visit Diagnoses    Hyperlipidemia, unspecified hyperlipidemia type    -  Primary   Relevant Medications   rosuvastatin (CRESTOR) 10 MG  tablet   Other Relevant Orders   Lipid panel    -He has done excellent job with weight loss about 15 pounds over the past few months by restricting carbohydrate intake  -Continue regular exercise habits  -We will try titrating the Crestor back up to 10 mg daily and placed future order for repeat lipids in about 3 months  Meds ordered this encounter  Medications  . rosuvastatin (CRESTOR) 10 MG tablet    Sig: Take 1 tablet (10 mg total) by mouth daily.    Dispense:  90 tablet    Refill:  3    Follow-up: No follow-ups on file.    Carolann Littler, MD

## 2020-10-26 DIAGNOSIS — H25012 Cortical age-related cataract, left eye: Secondary | ICD-10-CM | POA: Diagnosis not present

## 2020-10-26 DIAGNOSIS — H2512 Age-related nuclear cataract, left eye: Secondary | ICD-10-CM | POA: Diagnosis not present

## 2020-11-03 DIAGNOSIS — H25812 Combined forms of age-related cataract, left eye: Secondary | ICD-10-CM | POA: Diagnosis not present

## 2020-11-03 DIAGNOSIS — H2512 Age-related nuclear cataract, left eye: Secondary | ICD-10-CM | POA: Diagnosis not present

## 2020-11-03 DIAGNOSIS — H25012 Cortical age-related cataract, left eye: Secondary | ICD-10-CM | POA: Diagnosis not present

## 2020-11-18 DIAGNOSIS — D229 Melanocytic nevi, unspecified: Secondary | ICD-10-CM | POA: Diagnosis not present

## 2020-11-18 DIAGNOSIS — Z85828 Personal history of other malignant neoplasm of skin: Secondary | ICD-10-CM | POA: Diagnosis not present

## 2020-11-18 DIAGNOSIS — L905 Scar conditions and fibrosis of skin: Secondary | ICD-10-CM | POA: Diagnosis not present

## 2020-11-18 DIAGNOSIS — L821 Other seborrheic keratosis: Secondary | ICD-10-CM | POA: Diagnosis not present

## 2020-11-18 DIAGNOSIS — L570 Actinic keratosis: Secondary | ICD-10-CM | POA: Diagnosis not present

## 2020-11-18 DIAGNOSIS — L814 Other melanin hyperpigmentation: Secondary | ICD-10-CM | POA: Diagnosis not present

## 2020-12-08 ENCOUNTER — Telehealth: Payer: Self-pay | Admitting: Family Medicine

## 2020-12-08 NOTE — Telephone Encounter (Signed)
Left message for patient to call back and schedule Medicare Annual Wellness Visit (AWV) either virtually or in office.   Last AWV 04/30/14  please schedule at anytime with [facility] Nurse Health Advisor 1 or 2   This should be a 45 minute visit.

## 2021-01-07 ENCOUNTER — Ambulatory Visit: Payer: Medicare HMO | Admitting: Podiatry

## 2021-01-20 ENCOUNTER — Encounter: Payer: Self-pay | Admitting: Family Medicine

## 2021-02-02 ENCOUNTER — Other Ambulatory Visit: Payer: Self-pay

## 2021-02-02 ENCOUNTER — Encounter: Payer: Self-pay | Admitting: Family Medicine

## 2021-02-02 ENCOUNTER — Ambulatory Visit (INDEPENDENT_AMBULATORY_CARE_PROVIDER_SITE_OTHER): Payer: Medicare HMO | Admitting: Family Medicine

## 2021-02-02 VITALS — BP 110/62 | HR 50 | Temp 98.0°F | Wt 173.9 lb

## 2021-02-02 DIAGNOSIS — G5622 Lesion of ulnar nerve, left upper limb: Secondary | ICD-10-CM | POA: Diagnosis not present

## 2021-02-02 NOTE — Progress Notes (Signed)
Established Patient Office Visit  Subjective:  Patient ID: Hunter Collins, male    DOB: 01-Aug-1945  Age: 75 y.o. MRN: [id number]  CC:  Chief Complaint  Patient presents with   Numbness    Numbness in left arm and hand, mostly in the hand and fingers, no pain, tingling feeling, pt states he dislocated his elbow and few years ago and wonders if the old injury is causing this     HPI EMMET MESSER presents for left ulnar nerve involvement for the past 6 months.  He notices a tingling sensation with paresthesias involving his fifth digit and ulnar aspect of the fourth digit.  Symptoms progressive.  No major weakness.  No neck pain.  No cervical radiculitis pain.  He gives history of left elbow dislocation 2011.  This was a running injury where he fell on outstretched hand.  Did not have any reported fractures with that injury.  Generally sleeps on his side.  Symptoms are fairly persistent throughout the day and night.  Past Medical History:  Diagnosis Date   Allergy    Bradycardia    " MY HEART RATE IS USUALLY AROUD 50 - I EXERCISE A LOT"   Inguinal hernia    RIGHT    Past Surgical History:  Procedure Laterality Date   CATARACT EXTRACTION  10/19/2020   CLOSED REDUCTION ELBOW DISLOCATION     L ELBOW   INGUINAL HERNIA REPAIR Right 08/13/2015   Procedure: LAPAROSCOPIC RIGHT INGUINAL HERNIA WITH MESH;  Surgeon: Coralie Keens, MD;  Location: [facility];  Service: General;  Laterality: Right;   INSERTION OF MESH Right 08/13/2015   Procedure: INSERTION OF MESH;  Surgeon: Coralie Keens, MD;  Location: [facility];  Service: General;  Laterality: Right;   KNEE ARTHROSCOPY  2002   RT KNEE   TONSILLECTOMY AND ADENOIDECTOMY  1960    Family History  Problem Relation Age of Onset   Heart attack Mother    Pancreatic cancer Brother    Colon cancer Neg Hx     Social History   Socioeconomic History   Marital status: Married    Spouse name: Not on file   Number of children: Not on file    Years of education: Not on file   Highest education level: Not on file  Occupational History   Not on file  Tobacco Use   Smoking status: Never   Smokeless tobacco: Current    Types: Chew  Vaping Use   Vaping Use: Never used  Substance and Sexual Activity   Alcohol use: Yes    Alcohol/week: 3.0 standard drinks    Types: 3 Shots of liquor per week    Comment: weekends   Drug use: No   Sexual activity: Not on file  Other Topics Concern   Not on file  Social History Narrative   Not on file   Social Determinants of Health   Financial Resource Strain: Not on file  Food Insecurity: Not on file  Transportation Needs: Not on file  Physical Activity: Not on file  Stress: Not on file  Social Connections: Not on file  Intimate Partner Violence: Not on file    Outpatient Medications Prior to Visit  Medication Sig Dispense Refill   cycloSPORINE (RESTASIS) 0.05 % ophthalmic emulsion Place 1 drop into both eyes 2 (two) times daily. 5.5 mL 11   diphenhydrAMINE (BENADRYL) 25 mg capsule Take 25 mg by mouth every 6 (six) hours as needed.     furosemide (LASIX)  20 MG tablet TAKE 1 TABLET(20 MG) BY MOUTH DAILY 90 tablet 0   glucosamine-chondroitin 500-400 MG tablet Take 1 tablet by mouth daily.     ibuprofen (ADVIL,MOTRIN) 200 MG tablet Take 800 mg by mouth every 6 (six) hours as needed for moderate pain.     Multiple Vitamin (MULTIVITAMIN WITH MINERALS) TABS tablet Take 1 tablet by mouth daily.     Omega-3 Fatty Acids (FISH OIL) 1000 MG CAPS Take by mouth. Take One Capsule Daily.     rosuvastatin (CRESTOR) 10 MG tablet Take 1 tablet (10 mg total) by mouth daily. 90 tablet 3   No facility-administered medications prior to visit.    No Known Allergies  ROS Review of Systems  Respiratory:  Negative for shortness of breath.   Cardiovascular:  Negative for chest pain.  Neurological:  Positive for numbness. Negative for weakness.     Objective:    Physical Exam Vitals reviewed.   Constitutional:      Appearance: Normal appearance.  Cardiovascular:     Rate and Rhythm: Normal rate and regular rhythm.  Pulmonary:     Effort: Pulmonary effort is normal.     Breath sounds: Normal breath sounds.  Musculoskeletal:     Comments: No major muscle atrophy upper extremity.  No left elbow tenderness.  Full range of motion.  Neurological:     Mental Status: He is alert.     Comments: Good strength throughout upper extremities.  He does have some mild interosseous weakness on the left compared to the right.  Deep tendon reflexes are 2+ and symmetric throughout upper extremities    BP 110/62 (BP Location: Left Arm, Patient Position: Sitting, Cuff Size: Normal)   Pulse (!) 50   Temp 98 F (36.7 C) (Oral)   Wt 173 lb 14.4 oz (78.9 kg)   SpO2 97%   BMI 26.83 kg/m  Wt Readings from Last 3 Encounters:  02/02/21 173 lb 14.4 oz (78.9 kg)  10/25/20 179 lb 3.2 oz (81.3 kg)  07/27/20 194 lb (88 kg)     Health Maintenance Due  Topic Date Due   Zoster Vaccines- Shingrix (1 of 2) Never done   COVID-19 Vaccine (4 - Booster for Pfizer series) 07/30/2020   COLONOSCOPY (Pts 45-35yrs Insurance coverage will need to be confirmed)  10/14/2020    There are no preventive care reminders to display for this patient.  Lab Results  Component Value Date   TSH 2.01 07/23/2019   Lab Results  Component Value Date   WBC 4.4 07/23/2019   HGB 14.8 07/23/2019   HCT 44.0 07/23/2019   MCV 98.0 07/23/2019   PLT 194.0 07/23/2019   Lab Results  Component Value Date   NA 137 07/23/2019   K 4.9 07/23/2019   CO2 27 07/23/2019   GLUCOSE 88 07/23/2019   BUN 23 07/23/2019   CREATININE 0.97 07/23/2019   BILITOT 0.8 05/31/2020   ALKPHOS 55 11/13/2019   AST 20 05/31/2020   ALT 21 05/31/2020   PROT 6.7 05/31/2020   ALBUMIN 4.4 11/13/2019   CALCIUM 9.8 07/23/2019   GFR 75.28 07/23/2019   Lab Results  Component Value Date   CHOL 289 (H) 10/22/2020   Lab Results  Component Value Date    HDL 51.70 10/22/2020   Lab Results  Component Value Date   LDLCALC 219 (H) 10/22/2020   Lab Results  Component Value Date   TRIG 92.0 10/22/2020   Lab Results  Component Value Date   CHOLHDL  6 10/22/2020   No results found for: HGBA1C    Assessment & Plan:   Paresthesias involving ulnar nerve left upper extremity.  Remote history 11 years ago of left elbow dislocation.  Symptoms progressive for over a year  -Set up referral to hand/upper extremity orthopedic specialist -Avoid compression at the elbow level   No orders of the defined types were placed in this encounter.   Follow-up: No follow-ups on file.    Carolann Littler, MD

## 2021-02-04 ENCOUNTER — Encounter: Payer: Self-pay | Admitting: Family Medicine

## 2021-02-23 ENCOUNTER — Encounter: Payer: Self-pay | Admitting: Family Medicine

## 2021-02-23 DIAGNOSIS — E785 Hyperlipidemia, unspecified: Secondary | ICD-10-CM

## 2021-02-24 NOTE — Telephone Encounter (Signed)
I placed future order for lipid and hepatic panel.  Don't know if he might want to get those tomorrow so we have back by next visit?

## 2021-02-26 ENCOUNTER — Encounter: Payer: Self-pay | Admitting: Family Medicine

## 2021-02-27 ENCOUNTER — Encounter: Payer: Self-pay | Admitting: Family Medicine

## 2021-02-28 ENCOUNTER — Ambulatory Visit (INDEPENDENT_AMBULATORY_CARE_PROVIDER_SITE_OTHER): Payer: Medicare HMO | Admitting: Family Medicine

## 2021-02-28 ENCOUNTER — Other Ambulatory Visit: Payer: Self-pay

## 2021-02-28 VITALS — BP 130/74 | HR 50 | Temp 97.6°F | Wt 174.8 lb

## 2021-02-28 DIAGNOSIS — E785 Hyperlipidemia, unspecified: Secondary | ICD-10-CM

## 2021-02-28 LAB — LIPID PANEL
Cholesterol: 322 mg/dL — ABNORMAL HIGH (ref 0–200)
HDL: 54 mg/dL (ref >39.00)
LDL Cholesterol: 245 mg/dL — ABNORMAL HIGH (ref 0–99)
NonHDL: 267.63
Total CHOL/HDL Ratio: 6
Triglycerides: 114 mg/dL (ref 0.0–149.0)
VLDL: 22.8 mg/dL (ref 0.0–40.0)

## 2021-02-28 LAB — HEPATIC FUNCTION PANEL
ALT: 20 U/L (ref 0–53)
AST: 20 U/L (ref 0–37)
Albumin: 4.3 g/dL (ref 3.5–5.2)
Alkaline Phosphatase: 57 U/L (ref 39–117)
Bilirubin, Direct: 0.1 mg/dL (ref 0.0–0.3)
Total Bilirubin: 0.7 mg/dL (ref 0.2–1.2)
Total Protein: 6.8 g/dL (ref 6.0–8.3)

## 2021-02-28 NOTE — Progress Notes (Signed)
Established Patient Office Visit  Subjective:  Patient ID: Hunter Collins, male    DOB: September 17, 1945  Age: 75 y.o. MRN: [id number]  CC:  Chief Complaint  Patient presents with   Follow-up    Follow up on labs a weight loss efforts    HPI Hunter Collins presents for follow-up hyperlipidemia.  He had gained up above 190 pounds last winter and has done tremendous job since then with weight loss.  He has had intentional weight loss down currently 174 through restricting carbs.  He has good baseline activity and walks over 6 to 7 miles per day.  Good tolerance with activity with no recent dizziness, syncope, chest pains, or dyspnea.  No history of diabetes.  No family history of premature CAD.  His mother had a stroke but had several other health problems as well.  Past Medical History:  Diagnosis Date   Allergy    Bradycardia    " MY HEART RATE IS USUALLY AROUD 50 - I EXERCISE A LOT"   Inguinal hernia    RIGHT    Past Surgical History:  Procedure Laterality Date   CATARACT EXTRACTION  10/19/2020   CLOSED REDUCTION ELBOW DISLOCATION     L ELBOW   INGUINAL HERNIA REPAIR Right 08/13/2015   Procedure: LAPAROSCOPIC RIGHT INGUINAL HERNIA WITH MESH;  Surgeon: Coralie Keens, MD;  Location: [facility];  Service: General;  Laterality: Right;   INSERTION OF MESH Right 08/13/2015   Procedure: INSERTION OF MESH;  Surgeon: Coralie Keens, MD;  Location: [facility];  Service: General;  Laterality: Right;   KNEE ARTHROSCOPY  2002   RT KNEE   TONSILLECTOMY AND ADENOIDECTOMY  1960    Family History  Problem Relation Age of Onset   Heart attack Mother    Pancreatic cancer Brother    Colon cancer Neg Hx     Social History   Socioeconomic History   Marital status: Married    Spouse name: Not on file   Number of children: Not on file   Years of education: Not on file   Highest education level: Not on file  Occupational History   Not on file  Tobacco Use   Smoking status: Never   Smokeless  tobacco: Current    Types: Chew  Vaping Use   Vaping Use: Never used  Substance and Sexual Activity   Alcohol use: Yes    Alcohol/week: 3.0 standard drinks    Types: 3 Shots of liquor per week    Comment: weekends   Drug use: No   Sexual activity: Not on file  Other Topics Concern   Not on file  Social History Narrative   Not on file   Social Determinants of Health   Financial Resource Strain: Not on file  Food Insecurity: Not on file  Transportation Needs: Not on file  Physical Activity: Not on file  Stress: Not on file  Social Connections: Not on file  Intimate Partner Violence: Not on file    Outpatient Medications Prior to Visit  Medication Sig Dispense Refill   cycloSPORINE (RESTASIS) 0.05 % ophthalmic emulsion Place 1 drop into both eyes 2 (two) times daily. 5.5 mL 11   diphenhydrAMINE (BENADRYL) 25 mg capsule Take 25 mg by mouth every 6 (six) hours as needed.     furosemide (LASIX) 20 MG tablet TAKE 1 TABLET(20 MG) BY MOUTH DAILY 90 tablet 0   glucosamine-chondroitin 500-400 MG tablet Take 1 tablet by mouth daily.  ibuprofen (ADVIL,MOTRIN) 200 MG tablet Take 800 mg by mouth every 6 (six) hours as needed for moderate pain.     Multiple Vitamin (MULTIVITAMIN WITH MINERALS) TABS tablet Take 1 tablet by mouth daily.     Omega-3 Fatty Acids (FISH OIL) 1000 MG CAPS Take by mouth. Take One Capsule Daily.     rosuvastatin (CRESTOR) 10 MG tablet Take 1 tablet (10 mg total) by mouth daily. 90 tablet 3   No facility-administered medications prior to visit.    No Known Allergies  ROS Review of Systems  Constitutional:  Negative for appetite change.  Respiratory:  Negative for shortness of breath.   Cardiovascular:  Negative for chest pain and palpitations.  Neurological:  Negative for dizziness.     Objective:      BP 130/74 (BP Location: Left Arm, Patient Position: Sitting, Cuff Size: Normal)   Pulse (!) 50   Temp 97.6 F (36.4 C) (Oral)   Wt 174 lb 12.8 oz  (79.3 kg)   SpO2 98%   BMI 26.97 kg/m  Wt Readings from Last 3 Encounters:  02/28/21 174 lb 12.8 oz (79.3 kg)  02/02/21 173 lb 14.4 oz (78.9 kg)  10/25/20 179 lb 3.2 oz (81.3 kg)     Health Maintenance Due  Topic Date Due   Zoster Vaccines- Shingrix (1 of 2) Never done   COVID-19 Vaccine (4 - Booster for Pfizer series) 07/30/2020   COLONOSCOPY (Pts 45-39yr Insurance coverage will need to be confirmed)  10/14/2020   INFLUENZA VACCINE  02/21/2021    There are no preventive care reminders to display for this patient.  Lab Results  Component Value Date   TSH 2.01 07/23/2019   Lab Results  Component Value Date   WBC 4.4 07/23/2019   HGB 14.8 07/23/2019   HCT 44.0 07/23/2019   MCV 98.0 07/23/2019   PLT 194.0 07/23/2019   Lab Results  Component Value Date   NA 137 07/23/2019   K 4.9 07/23/2019   CO2 27 07/23/2019   GLUCOSE 88 07/23/2019   BUN 23 07/23/2019   CREATININE 0.97 07/23/2019   BILITOT 0.8 05/31/2020   ALKPHOS 55 11/13/2019   AST 20 05/31/2020   ALT 21 05/31/2020   PROT 6.7 05/31/2020   ALBUMIN 4.4 11/13/2019   CALCIUM 9.8 07/23/2019   GFR 75.28 07/23/2019   Lab Results  Component Value Date   CHOL 289 (H) 10/22/2020   Lab Results  Component Value Date   HDL 51.70 10/22/2020   Lab Results  Component Value Date   LDLCALC 219 (H) 10/22/2020   Lab Results  Component Value Date   TRIG 92.0 10/22/2020   Lab Results  Component Value Date   CHOLHDL 6 10/22/2020   No results found for: HGBA1C    Assessment & Plan:   Hyperlipidemia.  Patient on low-dose Crestor.  He has done a tremendous job with weight loss over the past several months and continues to exercise regularly.  -Recheck lipid and hepatic panel today -Continue low saturated fat diet and regular exercise habits   No orders of the defined types were placed in this encounter.   Follow-up: No follow-ups on file.    BCarolann Littler MD

## 2021-02-28 NOTE — Telephone Encounter (Signed)
Patient sent in another Mychart message stating to disregard this message and that he will be able to attend his appointment. Nothing further needed.

## 2021-03-01 ENCOUNTER — Encounter: Payer: Self-pay | Admitting: Family Medicine

## 2021-03-06 ENCOUNTER — Encounter: Payer: Self-pay | Admitting: Internal Medicine

## 2021-03-07 DIAGNOSIS — H04123 Dry eye syndrome of bilateral lacrimal glands: Secondary | ICD-10-CM | POA: Diagnosis not present

## 2021-03-07 DIAGNOSIS — H532 Diplopia: Secondary | ICD-10-CM | POA: Diagnosis not present

## 2021-03-25 DIAGNOSIS — M25522 Pain in left elbow: Secondary | ICD-10-CM | POA: Diagnosis not present

## 2021-04-11 ENCOUNTER — Encounter: Payer: Self-pay | Admitting: Podiatry

## 2021-04-11 ENCOUNTER — Ambulatory Visit (INDEPENDENT_AMBULATORY_CARE_PROVIDER_SITE_OTHER): Payer: Medicare HMO | Admitting: Podiatry

## 2021-04-11 ENCOUNTER — Other Ambulatory Visit: Payer: Self-pay

## 2021-04-11 DIAGNOSIS — M79675 Pain in left toe(s): Secondary | ICD-10-CM | POA: Diagnosis not present

## 2021-04-11 DIAGNOSIS — B351 Tinea unguium: Secondary | ICD-10-CM | POA: Diagnosis not present

## 2021-04-11 DIAGNOSIS — L84 Corns and callosities: Secondary | ICD-10-CM | POA: Diagnosis not present

## 2021-04-11 DIAGNOSIS — M79674 Pain in right toe(s): Secondary | ICD-10-CM | POA: Diagnosis not present

## 2021-04-11 DIAGNOSIS — G5622 Lesion of ulnar nerve, left upper limb: Secondary | ICD-10-CM | POA: Diagnosis not present

## 2021-04-11 NOTE — Progress Notes (Signed)
This patient returns to the office for evaluation and treatment of thick painful corn on the fifth toe right foot..  This patient is unable to trim his corn since the patient cannot reach his feet.  Patient says the corn is  painful walking and wearing his shoes.  He returns for preventive foot care services.  General Appearance  Alert, conversant and in no acute stress.  Vascular  Dorsalis pedis and posterior tibial  pulses are palpable  bilaterally.  Capillary return is within normal limits  bilaterally. Temperature is within normal limits  bilaterally.  Neurologic  Senn-Weinstein monofilament wire test within normal limits  bilaterally. Muscle power within normal limits bilaterally.  Nails Thick disfigured discolored nails with subungual debris second toenail  and third toenail right foot.. No evidence of bacterial infection or drainage bilaterally.  Orthopedic  No limitations of motion  feet .  No crepitus or effusions noted.  No bony pathology or digital deformities noted.  Skin  normotropic skin with no porokeratosis noted bilaterally.  No signs of infections or ulcers noted.   Heloma durum fifth toe right foot.  Heloma durum fifth toe right foot.  Onychomycosis  2.3 right  Debridement  Of heloma durum fifth toe right foot. Debride 2,3 right foot with nail nipper followed by dremel tool. RTC 3 months    Gardiner Barefoot DPM

## 2021-04-20 ENCOUNTER — Encounter: Payer: Self-pay | Admitting: Family Medicine

## 2021-04-21 ENCOUNTER — Other Ambulatory Visit: Payer: Self-pay

## 2021-04-21 DIAGNOSIS — E785 Hyperlipidemia, unspecified: Secondary | ICD-10-CM

## 2021-04-21 MED ORDER — ROSUVASTATIN CALCIUM 20 MG PO TABS: 20.0000 mg | ORAL_TABLET | Freq: Every day | ORAL | 1 refills | Status: DC

## 2021-04-21 MED ORDER — ROSUVASTATIN CALCIUM 20 MG PO TABS: 20.0000 mg | ORAL_TABLET | Freq: Every day | ORAL | 3 refills | Status: DC

## 2021-04-22 DIAGNOSIS — G5622 Lesion of ulnar nerve, left upper limb: Secondary | ICD-10-CM | POA: Diagnosis not present

## 2021-05-16 DIAGNOSIS — L570 Actinic keratosis: Secondary | ICD-10-CM | POA: Diagnosis not present

## 2021-05-16 DIAGNOSIS — L814 Other melanin hyperpigmentation: Secondary | ICD-10-CM | POA: Diagnosis not present

## 2021-05-16 DIAGNOSIS — L819 Disorder of pigmentation, unspecified: Secondary | ICD-10-CM | POA: Diagnosis not present

## 2021-05-16 DIAGNOSIS — Z85828 Personal history of other malignant neoplasm of skin: Secondary | ICD-10-CM | POA: Diagnosis not present

## 2021-05-16 DIAGNOSIS — L818 Other specified disorders of pigmentation: Secondary | ICD-10-CM | POA: Diagnosis not present

## 2021-05-16 DIAGNOSIS — Z08 Encounter for follow-up examination after completed treatment for malignant neoplasm: Secondary | ICD-10-CM | POA: Diagnosis not present

## 2021-05-30 ENCOUNTER — Other Ambulatory Visit (INDEPENDENT_AMBULATORY_CARE_PROVIDER_SITE_OTHER): Payer: Medicare HMO

## 2021-05-30 ENCOUNTER — Other Ambulatory Visit: Payer: Self-pay

## 2021-05-30 DIAGNOSIS — E785 Hyperlipidemia, unspecified: Secondary | ICD-10-CM | POA: Diagnosis not present

## 2021-05-30 DIAGNOSIS — H04123 Dry eye syndrome of bilateral lacrimal glands: Secondary | ICD-10-CM | POA: Diagnosis not present

## 2021-05-30 DIAGNOSIS — H35363 Drusen (degenerative) of macula, bilateral: Secondary | ICD-10-CM | POA: Diagnosis not present

## 2021-05-30 DIAGNOSIS — H532 Diplopia: Secondary | ICD-10-CM | POA: Diagnosis not present

## 2021-05-30 LAB — LIPID PANEL
Cholesterol: 139 mg/dL (ref 0–200)
HDL: 50.1 mg/dL (ref >39.00)
LDL Cholesterol: 73 mg/dL (ref 0–99)
NonHDL: 89.15
Total CHOL/HDL Ratio: 3
Triglycerides: 83 mg/dL (ref 0.0–149.0)
VLDL: 16.6 mg/dL (ref 0.0–40.0)

## 2021-06-03 ENCOUNTER — Telehealth: Payer: Self-pay | Admitting: Internal Medicine

## 2021-06-03 NOTE — Telephone Encounter (Signed)
Spoke with patient who stated will call back to schedule appt.

## 2021-06-03 NOTE — Telephone Encounter (Signed)
Good morning Dr. Hilarie Fredrickson, patient called stating he received his reminder letter for his recall colon.  Please advise if ok to schedule direct or an office visit.  Thank you.

## 2021-06-03 NOTE — Telephone Encounter (Signed)
PCP note reviewed from Aug 2022 [place] for procedure without office visit

## 2021-06-08 ENCOUNTER — Encounter: Payer: Self-pay | Admitting: Family Medicine

## 2021-06-21 ENCOUNTER — Other Ambulatory Visit: Payer: Self-pay | Admitting: Family Medicine

## 2021-07-06 ENCOUNTER — Encounter: Payer: Self-pay | Admitting: Family Medicine

## 2021-07-06 ENCOUNTER — Telehealth: Payer: Self-pay

## 2021-07-06 DIAGNOSIS — G8918 Other acute postprocedural pain: Secondary | ICD-10-CM | POA: Diagnosis not present

## 2021-07-06 DIAGNOSIS — M24522 Contracture, left elbow: Secondary | ICD-10-CM | POA: Diagnosis not present

## 2021-07-06 DIAGNOSIS — G5622 Lesion of ulnar nerve, left upper limb: Secondary | ICD-10-CM | POA: Diagnosis not present

## 2021-07-06 NOTE — Telephone Encounter (Signed)
Patient  called and was informed of message and stated he is in severe pain and would like to be worked in today.

## 2021-07-06 NOTE — Telephone Encounter (Signed)
Spoke with the patient. I informed him that Dr. Elease Hashimoto does not have any openings today and offered and appointment for another day. The patient declined. I then advised him to go to urgent care if he felt like this was something that could not wait. Patient stated he will got o urgent care. Nothing further needed.

## 2021-07-07 ENCOUNTER — Encounter: Payer: Self-pay | Admitting: Podiatry

## 2021-07-07 ENCOUNTER — Ambulatory Visit: Payer: Medicare HMO | Admitting: Podiatry

## 2021-07-07 ENCOUNTER — Other Ambulatory Visit: Payer: Self-pay

## 2021-07-07 DIAGNOSIS — M7752 Other enthesopathy of left foot: Secondary | ICD-10-CM

## 2021-07-07 MED ORDER — TRIAMCINOLONE ACETONIDE 10 MG/ML IJ SUSP
10.0000 mg | Freq: Once | INTRAMUSCULAR | Status: AC
  Administered 2021-07-07: 10 mg

## 2021-07-09 NOTE — Progress Notes (Signed)
Subjective:   Patient ID: Hunter Collins, male   DOB: 75 y.o.   MRN: [id number]   HPI Patient states he is redeveloped discomfort in the left ankle and states it seemed to have been improving for an extended period of time but is started up again over the last month or 2   ROS      Objective:  Physical Exam  Neurovascular status intact with inflammation pain of the sinus tarsi and ankle joint left with mild swelling of the outside of the ankle and     Assessment:  Inflammatory capsulitis of the left ankle and into the sinus tarsi lateral foot     Plan:  H&P x-ray reviewed sterile prep and injected sinus tarsi and lateral foot 3 mg Kenalog 5 mg Xylocaine advised on elevation and will be seen back if symptoms were to persist or anything else were to occur  X-rays indicate quite a bit of arthritis flatfoot deformity no indication stress fracture or other pathology

## 2021-07-19 DIAGNOSIS — G5622 Lesion of ulnar nerve, left upper limb: Secondary | ICD-10-CM | POA: Diagnosis not present

## 2021-07-28 DIAGNOSIS — M25622 Stiffness of left elbow, not elsewhere classified: Secondary | ICD-10-CM | POA: Diagnosis not present

## 2021-08-01 ENCOUNTER — Encounter: Payer: Medicare HMO | Admitting: Family Medicine

## 2021-08-15 ENCOUNTER — Other Ambulatory Visit: Payer: Self-pay

## 2021-08-15 ENCOUNTER — Encounter: Payer: Self-pay | Admitting: Podiatry

## 2021-08-15 ENCOUNTER — Ambulatory Visit: Payer: Medicare HMO | Admitting: Podiatry

## 2021-08-15 DIAGNOSIS — B351 Tinea unguium: Secondary | ICD-10-CM

## 2021-08-15 DIAGNOSIS — M79675 Pain in left toe(s): Secondary | ICD-10-CM

## 2021-08-15 DIAGNOSIS — L84 Corns and callosities: Secondary | ICD-10-CM | POA: Diagnosis not present

## 2021-08-15 DIAGNOSIS — M79674 Pain in right toe(s): Secondary | ICD-10-CM

## 2021-08-15 NOTE — Progress Notes (Signed)
This patient returns to the office for evaluation and treatment of thick painful corn on the fifth toe right foot..  This patient is unable to trim his corn since the patient cannot reach his feet.  Patient says the corn is  painful walking and wearing his shoes.  He returns for preventive foot care services.  General Appearance  Alert, conversant and in no acute stress.  Vascular  Dorsalis pedis and posterior tibial  pulses are palpable  bilaterally.  Capillary return is within normal limits  bilaterally. Temperature is within normal limits  bilaterally.  Neurologic  Senn-Weinstein monofilament wire test within normal limits  bilaterally. Muscle power within normal limits bilaterally.  Nails Thick disfigured discolored nails with subungual debris second toenail  and third toenail right foot.. No evidence of bacterial infection or drainage bilaterally.  Orthopedic  No limitations of motion  feet .  No crepitus or effusions noted.  No bony pathology or digital deformities noted.  Skin  normotropic skin with no porokeratosis noted bilaterally.  No signs of infections or ulcers noted.   Heloma durum fifth toe right foot.  Heloma durum fifth toe right foot.  Onychomycosis  2.3 right  Debridement  of heloma durum fifth toe right foot. Debride 2,3 right foot with nail nipper followed by dremel tool.  Nails were done as a courtesy  .RTC 3 months    Gardiner Barefoot DPM

## 2021-09-29 DIAGNOSIS — Z0000 Encounter for general adult medical examination without abnormal findings: Secondary | ICD-10-CM | POA: Diagnosis not present

## 2021-09-29 DIAGNOSIS — Z136 Encounter for screening for cardiovascular disorders: Secondary | ICD-10-CM | POA: Diagnosis not present

## 2021-09-29 DIAGNOSIS — E785 Hyperlipidemia, unspecified: Secondary | ICD-10-CM | POA: Diagnosis not present

## 2021-09-29 DIAGNOSIS — Z125 Encounter for screening for malignant neoplasm of prostate: Secondary | ICD-10-CM | POA: Diagnosis not present

## 2021-11-14 DIAGNOSIS — L718 Other rosacea: Secondary | ICD-10-CM | POA: Diagnosis not present

## 2021-11-14 DIAGNOSIS — L84 Corns and callosities: Secondary | ICD-10-CM | POA: Diagnosis not present

## 2021-11-14 DIAGNOSIS — L821 Other seborrheic keratosis: Secondary | ICD-10-CM | POA: Diagnosis not present

## 2021-11-14 DIAGNOSIS — L814 Other melanin hyperpigmentation: Secondary | ICD-10-CM | POA: Diagnosis not present

## 2021-11-14 DIAGNOSIS — D225 Melanocytic nevi of trunk: Secondary | ICD-10-CM | POA: Diagnosis not present

## 2021-11-14 DIAGNOSIS — L570 Actinic keratosis: Secondary | ICD-10-CM | POA: Diagnosis not present

## 2021-11-14 DIAGNOSIS — R234 Changes in skin texture: Secondary | ICD-10-CM | POA: Diagnosis not present

## 2021-11-28 DIAGNOSIS — H35363 Drusen (degenerative) of macula, bilateral: Secondary | ICD-10-CM | POA: Diagnosis not present

## 2021-11-28 DIAGNOSIS — Z961 Presence of intraocular lens: Secondary | ICD-10-CM | POA: Diagnosis not present

## 2021-11-28 DIAGNOSIS — H04123 Dry eye syndrome of bilateral lacrimal glands: Secondary | ICD-10-CM | POA: Diagnosis not present

## 2021-11-28 DIAGNOSIS — H532 Diplopia: Secondary | ICD-10-CM | POA: Diagnosis not present

## 2021-12-05 ENCOUNTER — Ambulatory Visit: Payer: Medicare HMO | Admitting: Podiatry

## 2022-01-02 ENCOUNTER — Ambulatory Visit: Payer: Medicare HMO | Admitting: Podiatry

## 2022-01-02 ENCOUNTER — Encounter: Payer: Self-pay | Admitting: Podiatry

## 2022-01-02 DIAGNOSIS — L84 Corns and callosities: Secondary | ICD-10-CM

## 2022-01-02 DIAGNOSIS — M2041 Other hammer toe(s) (acquired), right foot: Secondary | ICD-10-CM

## 2022-01-02 NOTE — Progress Notes (Signed)
This patient returns to the office for evaluation and treatment of thick painful corn on the fifth toe right foot..  This patient is unable to trim his corn since the patient cannot reach his feet.  Patient says the corn is  painful walking and wearing his shoes.  He returns for preventive foot care services.  General Appearance  Alert, conversant and in no acute stress.  Vascular  Dorsalis pedis and posterior tibial  pulses are palpable  bilaterally.  Capillary return is within normal limits  bilaterally. Temperature is within normal limits  bilaterally.  Neurologic  Senn-Weinstein monofilament wire test within normal limits  bilaterally. Muscle power within normal limits bilaterally.  Nails Thick disfigured discolored nails with subungual debris second toenail  and third toenail right foot.. No evidence of bacterial infection or drainage bilaterally.  Orthopedic  No limitations of motion  feet .  No crepitus or effusions noted.  No bony pathology or digital deformities noted.  Skin  normotropic skin with no porokeratosis noted bilaterally.  No signs of infections or ulcers noted.   Heloma durum fifth toe right foot.  Heloma durum fifth toe right foot.  Onychomycosis  2.3 right  Debridement  of heloma durum fifth toe right foot. Debride 2,3 right foot with nail nipper followed by dremel tool.  Nails were done as a courtesy  .RTC 3 months    Gardiner Barefoot DPM

## 2022-04-21 DIAGNOSIS — K529 Noninfective gastroenteritis and colitis, unspecified: Secondary | ICD-10-CM | POA: Diagnosis not present

## 2022-05-08 ENCOUNTER — Ambulatory Visit: Payer: Medicare HMO | Admitting: Podiatry

## 2022-05-08 ENCOUNTER — Encounter: Payer: Self-pay | Admitting: Podiatry

## 2022-05-08 DIAGNOSIS — L84 Corns and callosities: Secondary | ICD-10-CM | POA: Diagnosis not present

## 2022-05-08 DIAGNOSIS — B351 Tinea unguium: Secondary | ICD-10-CM | POA: Diagnosis not present

## 2022-05-08 DIAGNOSIS — M79675 Pain in left toe(s): Secondary | ICD-10-CM | POA: Diagnosis not present

## 2022-05-08 DIAGNOSIS — M79674 Pain in right toe(s): Secondary | ICD-10-CM | POA: Diagnosis not present

## 2022-05-08 NOTE — Progress Notes (Signed)
This patient returns to the office for evaluation and treatment of thick painful corn on the fifth toe right foot..  This patient is unable to trim his corn since the patient cannot reach his feet.  Patient says the corn is  painful walking and wearing his shoes.  He returns for preventive foot care services.  General Appearance  Alert, conversant and in no acute stress.  Vascular  Dorsalis pedis and posterior tibial  pulses are palpable  bilaterally.  Capillary return is within normal limits  bilaterally. Temperature is within normal limits  bilaterally.  Neurologic  Senn-Weinstein monofilament wire test within normal limits  bilaterally. Muscle power within normal limits bilaterally.  Nails Thick disfigured discolored nails with subungual debris second toenail  and third toenail right foot.. No evidence of bacterial infection or drainage bilaterally.  Orthopedic  No limitations of motion  feet .  No crepitus or effusions noted.  No bony pathology or digital deformities noted.  Skin  normotropic skin with no porokeratosis noted bilaterally.  No signs of infections or ulcers noted.   Heloma durum fifth toe right foot. Hallux limitus 1st MPJ left foot.  Heloma durum fifth toe right foot.  Onychomycosis  2.3 right  Debridement  of heloma durum fifth toe right foot. Debride 2,3 right foot with nail nipper followed by dremel tool.  Nails were done as a courtesy  .RTC 3 months    Gardiner Barefoot DPM

## 2022-05-19 DIAGNOSIS — J329 Chronic sinusitis, unspecified: Secondary | ICD-10-CM | POA: Diagnosis not present

## 2022-05-22 DIAGNOSIS — L821 Other seborrheic keratosis: Secondary | ICD-10-CM | POA: Diagnosis not present

## 2022-05-22 DIAGNOSIS — L814 Other melanin hyperpigmentation: Secondary | ICD-10-CM | POA: Diagnosis not present

## 2022-05-22 DIAGNOSIS — Z85828 Personal history of other malignant neoplasm of skin: Secondary | ICD-10-CM | POA: Diagnosis not present

## 2022-05-22 DIAGNOSIS — Z08 Encounter for follow-up examination after completed treatment for malignant neoplasm: Secondary | ICD-10-CM | POA: Diagnosis not present

## 2022-05-22 DIAGNOSIS — D225 Melanocytic nevi of trunk: Secondary | ICD-10-CM | POA: Diagnosis not present

## 2022-05-22 DIAGNOSIS — L570 Actinic keratosis: Secondary | ICD-10-CM | POA: Diagnosis not present

## 2022-05-22 DIAGNOSIS — L578 Other skin changes due to chronic exposure to nonionizing radiation: Secondary | ICD-10-CM | POA: Diagnosis not present

## 2022-07-23 ENCOUNTER — Other Ambulatory Visit: Payer: Self-pay | Admitting: Family Medicine

## 2022-07-23 DIAGNOSIS — E785 Hyperlipidemia, unspecified: Secondary | ICD-10-CM

## 2022-07-25 ENCOUNTER — Other Ambulatory Visit: Payer: Self-pay | Admitting: Family Medicine

## 2022-07-25 DIAGNOSIS — E785 Hyperlipidemia, unspecified: Secondary | ICD-10-CM

## 2022-08-02 DIAGNOSIS — J069 Acute upper respiratory infection, unspecified: Secondary | ICD-10-CM | POA: Diagnosis not present

## 2022-08-29 DIAGNOSIS — H04123 Dry eye syndrome of bilateral lacrimal glands: Secondary | ICD-10-CM | POA: Diagnosis not present

## 2022-08-29 DIAGNOSIS — H40043 Steroid responder, bilateral: Secondary | ICD-10-CM | POA: Diagnosis not present

## 2022-08-29 DIAGNOSIS — H524 Presbyopia: Secondary | ICD-10-CM | POA: Diagnosis not present

## 2022-08-29 DIAGNOSIS — Z961 Presence of intraocular lens: Secondary | ICD-10-CM | POA: Diagnosis not present

## 2022-08-29 DIAGNOSIS — H532 Diplopia: Secondary | ICD-10-CM | POA: Diagnosis not present

## 2022-08-30 DIAGNOSIS — K469 Unspecified abdominal hernia without obstruction or gangrene: Secondary | ICD-10-CM | POA: Diagnosis not present

## 2022-08-31 ENCOUNTER — Other Ambulatory Visit: Payer: Self-pay | Admitting: Physician Assistant

## 2022-08-31 DIAGNOSIS — K460 Unspecified abdominal hernia with obstruction, without gangrene: Secondary | ICD-10-CM

## 2022-09-11 ENCOUNTER — Ambulatory Visit (INDEPENDENT_AMBULATORY_CARE_PROVIDER_SITE_OTHER): Payer: Medicare HMO | Admitting: Podiatry

## 2022-09-11 ENCOUNTER — Encounter: Payer: Self-pay | Admitting: Podiatry

## 2022-09-11 DIAGNOSIS — M79674 Pain in right toe(s): Secondary | ICD-10-CM

## 2022-09-11 DIAGNOSIS — L84 Corns and callosities: Secondary | ICD-10-CM | POA: Diagnosis not present

## 2022-09-11 DIAGNOSIS — M79676 Pain in unspecified toe(s): Secondary | ICD-10-CM | POA: Diagnosis not present

## 2022-09-11 DIAGNOSIS — B351 Tinea unguium: Secondary | ICD-10-CM

## 2022-09-11 NOTE — Progress Notes (Addendum)
This patient returns to the office for evaluation and treatment of thick painful corn on the fifth toe right foot..  This patient is unable to trim his corn since the patient cannot reach his feet.  Patient says the corn is  painful walking and wearing his shoes.  He returns for preventive foot care services.  General Appearance  Alert, conversant and in no acute stress.  Vascular  Dorsalis pedis and posterior tibial  pulses are palpable  bilaterally.  Capillary return is within normal limits  bilaterally. Temperature is within normal limits  bilaterally.  Neurologic  Senn-Weinstein monofilament wire test within normal limits  bilaterally. Muscle power within normal limits bilaterally.  Nails Thick disfigured discolored nails with subungual debris second toenail  and third toenail right foot.. No evidence of bacterial infection or drainage bilaterally.  Orthopedic  No limitations of motion  feet .  No crepitus or effusions noted.  No bony pathology or digital deformities noted.  Skin  normotropic skin with no porokeratosis noted bilaterally.  No signs of infections or ulcers noted.   Heloma durum fifth toe right foot. Hallux limitus 1st MPJ left foot  Onychomycosis  Debridement  of heloma durum fifth toe right foot. Debride 2,3 right foot with nail nipper followed by dremel tool.  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

## 2022-09-17 NOTE — Progress Notes (Signed)
New Patient Note  RE: Hunter Collins MRN: [id number] DOB: 1946-05-31 Date of Office Visit: 09/18/2022  Consult requested by: Turmel, Lenord Fellers, PA Primary care provider: Olen Cordial, PA  Chief Complaint: Establish Care, Nasal Congestion, and Sinus Problem  History of Present Illness: I had the pleasure of seeing Hunter Collins for initial evaluation at the [facility] on 09/18/2022. He is a 77 y.o. male, who is referred here by Cephas Darby, PA for the evaluation of sinus issues.  He reports symptoms of frequent sinus infections, rhinorrhea, nasal congestion, sneezing, PND. Symptoms have been going on for 10+ years. The symptoms are present all year around. Other triggers include exposure to unknown. Anosmia: no. Headache: no. He has used benadryl with fair improvement in symptoms. Sinus infections: 3 in the past 6 months. Previous work up includes: none. Previous ENT evaluation: 40-50 years ago and had T&A. Previous sinus imaging: no. History of nasal polyps: no. History of reflux: takes omeprazole daily with good benefit.   Assessment and Plan: Shafin is a 77 y.o. male with: Chronic rhinitis Perennial rhinitis symptoms of the last 10+ years with 3 sinus infections last 6 months.  Had T&A over 40+ years ago.  Uses Benadryl as needed with good benefit.  No prior allergy testing. Today's skin testing showed: Negative to indoor/outdoor allergens.  He most likely has chronic non-allergic rhinitis. Use Flonase (fluticasone) nasal spray 1 spray per nostril twice a day as needed for nasal congestion.  Use azelastine nasal spray 1-2 sprays per nostril twice a day as needed for runny nose/drainage. Nasal saline spray (i.e., Simply Saline) or nasal saline lavage (i.e., NeilMed) is recommended as needed and prior to medicated nasal sprays. If still having issues - recommend ENT evaluation next to look at sinus anatomy.  Recurrent sinusitis 3 sinus infections in the last 6  months. Keep track of infections and antibiotics use. If having more frequent infections then will get bloodwork to look at immune system next.   Return if symptoms worsen or fail to improve.  Meds ordered this encounter  Medications   azelastine (ASTELIN) 0.1 % nasal spray    Sig: Place 1-2 sprays into both nostrils 2 (two) times daily as needed (nasal drainage). Use in each nostril as directed    Dispense:  30 mL    Refill:  5   fluticasone (FLONASE) 50 MCG/ACT nasal spray    Sig: Place 1 spray into both nostrils 2 (two) times daily as needed (nasal congestion).    Dispense:  16 g    Refill:  5   Lab Orders  No laboratory test(s) ordered today    Other allergy screening: Asthma: no Food allergy: no Medication allergy: no Hymenoptera allergy: no Urticaria: no Eczema:no History of recurrent infections suggestive of immunodeficency: no  Diagnostics: Skin Testing: Environmental allergy panel. Negative to indoor/outdoor allergies. Results discussed with patient/family.  Airborne Adult Perc - 09/18/22 1000     Time Antigen Placed 1044    1. Control-Buffer 50% Glycerol Negative    2. Control-Histamine 1 mg/ml 2+    3. Albumin saline Negative    4. [place] Negative    5. [place] Negative    6. Johnson Negative    7. Newton Blue Negative    8. Meadow Fescue Negative    9. Perennial Rye Negative    10. Sweet Vernal Negative    11. Timothy Negative    12. Cocklebur Negative    13. Burweed  Marshelder Negative    14. Ragweed, short Negative    15. Ragweed, Giant Negative    16. Plantain,  English Negative    17. Lamb's Quarters Negative    18. Sheep Sorrell Negative    19. Rough Pigweed Negative    20. Marsh Elder, Rough Negative    21. Mugwort, Common Negative    22. Ash mix Negative    23. Birch mix Negative    24. Beech American Negative    25. Box, Elder Negative    26. Cedar, red Negative    27. Cottonwood, [place] Negative    28. Elm mix Negative    29.  Hickory Negative    30. Maple mix Negative    31. Oak, [place] mix Negative    32. Pecan Pollen Negative    33. Pine mix Negative    34. Sycamore Eastern Negative    35. Lawnside, Black Pollen Negative    36. Alternaria alternata Negative    37. Cladosporium Herbarum Negative    38. Aspergillus mix Negative    39. Penicillium mix Negative    40. Bipolaris sorokiniana (Helminthosporium) Negative    41. Drechslera spicifera (Curvularia) Negative    42. Mucor plumbeus Negative    43. Fusarium moniliforme Negative    44. Aureobasidium pullulans (pullulara) Negative    45. Rhizopus oryzae Negative    46. Botrytis cinera Negative    47. Epicoccum nigrum Negative    48. Phoma betae Negative    49. Candida Albicans Negative    50. Trichophyton mentagrophytes Negative    51. Mite, D Farinae  5,000 AU/ml Negative    52. Mite, D Pteronyssinus  5,000 AU/ml Negative    53. Cat Hair 10,000 BAU/ml Negative    54.  Dog Epithelia Negative    55. Mixed Feathers Negative    56. Horse Epithelia Negative    57. Cockroach, German Negative    58. Mouse Negative    59. Tobacco Leaf Negative             Intradermal - 09/18/22 1118     Time Antigen Placed 1118    Allergen Manufacturer Greer    Location Arm    Number of Test 15    Control Negative    Guatem[place] Negative    Johnson Negative    7 Grass Negative    Ragweed mix Negative    Weed mix Negative    Tree mix Negative    Mold 1 Negative    Mold 2 Negative    Mold 3 Negative    Mold 4 Negative    Cat Negative    Dog Negative    Cockroach Negative    Mite mix Negative             Past Medical History: Patient Active Problem List   Diagnosis Date Noted   Chronic rhinitis 09/18/2022   Recurrent sinusitis 09/18/2022   Heloma durum 10/01/2020   Pain due to onychomycosis of toenails of both feet 01/30/2020   Unilateral primary osteoarthritis, right knee 07/25/2016   Dyslipidemia 04/12/2009   Past Medical History:  Diagnosis  Date   Allergy    Bradycardia    " MY HEART RATE IS USUALLY AROUD 66 - I EXERCISE A LOT"   Inguinal hernia    RIGHT   Past Surgical History: Past Surgical History:  Procedure Laterality Date   ADENOIDECTOMY     CATARACT EXTRACTION  10/19/2020   CLOSED REDUCTION ELBOW DISLOCATION  L ELBOW   INGUINAL HERNIA REPAIR Right 08/13/2015   Procedure: LAPAROSCOPIC RIGHT INGUINAL HERNIA WITH MESH;  Surgeon: Coralie Keens, MD;  Location: [facility];  Service: General;  Laterality: Right;   INSERTION OF MESH Right 08/13/2015   Procedure: INSERTION OF MESH;  Surgeon: Coralie Keens, MD;  Location: [facility];  Service: General;  Laterality: Right;   KNEE ARTHROSCOPY  2002   RT KNEE   TONSILLECTOMY AND ADENOIDECTOMY  1960   Medication List:  Current Outpatient Medications  Medication Sig Dispense Refill   azelastine (ASTELIN) 0.1 % nasal spray Place 1-2 sprays into both nostrils 2 (two) times daily as needed (nasal drainage). Use in each nostril as directed 30 mL 5   diphenhydrAMINE (BENADRYL) 25 mg capsule Take 25 mg by mouth every 6 (six) hours as needed.     fluticasone (FLONASE) 50 MCG/ACT nasal spray Place 1 spray into both nostrils 2 (two) times daily as needed (nasal congestion). 16 g 5   glucosamine-chondroitin 500-400 MG tablet Take 1 tablet by mouth daily.     ibuprofen (ADVIL,MOTRIN) 200 MG tablet Take 800 mg by mouth every 6 (six) hours as needed for moderate pain.     Multiple Vitamin (MULTIVITAMIN WITH MINERALS) TABS tablet Take 1 tablet by mouth daily.     Omega-3 Fatty Acids (FISH OIL) 1000 MG CAPS Take by mouth. Take One Capsule Daily.     RESTASIS MULTIDOSE 0.05 % ophthalmic emulsion INSTILL 1 DROP IN BOTH EYES TWICE DAILY 5.5 mL 11   rosuvastatin (CRESTOR) 20 MG tablet Take 1 tablet (20 mg total) by mouth daily. SCHEDULE ANNUAL VISIT FOR FUTURE REFILLS 30 tablet 0   No current facility-administered medications for this visit.   Allergies: No Known Allergies Social  History: Social History   Socioeconomic History   Marital status: Married    Spouse name: Not on file   Number of children: Not on file   Years of education: Not on file   Highest education level: Not on file  Occupational History   Not on file  Tobacco Use   Smoking status: Never    Passive exposure: Never   Smokeless tobacco: Former    Types: Chew    Quit date: 2014  Vaping Use   Vaping Use: Never used  Substance and Sexual Activity   Alcohol use: Yes    Alcohol/week: 3.0 standard drinks of alcohol    Types: 3 Shots of liquor per week    Comment: weekends   Drug use: No   Sexual activity: Not on file  Other Topics Concern   Not on file  Social History Narrative   Not on file   Social Determinants of Health   Financial Resource Strain: Not on file  Food Insecurity: Not on file  Transportation Needs: Not on file  Physical Activity: Not on file  Stress: Not on file  Social Connections: Not on file   Lives in a 79 year old house. Smoking: denies Occupation: Chief Operating Officer HistoryFreight forwarder in the house: no Charity fundraiser in the family room: no Carpet in the bedroom: no Heating: gas Cooling: central Pet: yes 1 dog x 13 yrs  Family History: Family History  Problem Relation Age of Onset   Heart attack Mother    Pancreatic cancer Brother    Colon cancer Neg Hx    Problem  Relation Asthma                                   no Eczema                                no Food allergy                          no Allergic rhino conjunctivitis     no  Review of Systems  Constitutional:  Negative for appetite change, chills, fever and unexpected weight change.  HENT:  Positive for congestion, postnasal drip, rhinorrhea and sneezing.   Eyes:  Negative for itching.  Respiratory:  Negative for cough, chest tightness, shortness of breath and wheezing.   Cardiovascular:  Negative for chest pain.  Gastrointestinal:  Negative for  abdominal pain.  Genitourinary:  Negative for difficulty urinating.  Skin:  Negative for rash.  Allergic/Immunologic: Negative for environmental allergies.  Neurological:  Negative for headaches.    Objective: BP 120/88 (BP Location: Left Arm, Patient Position: Sitting)   Pulse 75   Temp 98.3 F (36.8 C)   Resp 20   Ht 5' 8.5" (1.74 m)   Wt 185 lb 14.4 oz (84.3 kg)   SpO2 94%   BMI 27.85 kg/m  Body mass index is 27.85 kg/m. Physical Exam Vitals and nursing note reviewed.  Constitutional:      Appearance: Normal appearance. He is well-developed.  HENT:     Head: Normocephalic and atraumatic.     Right Ear: Tympanic membrane and external ear normal.     Left Ear: Tympanic membrane and external ear normal.     Nose: Nose normal.     Mouth/Throat:     Mouth: Mucous membranes are moist.     Pharynx: Oropharynx is clear.  Eyes:     Conjunctiva/sclera: Conjunctivae normal.  Cardiovascular:     Rate and Rhythm: Normal rate and regular rhythm.     Heart sounds: Normal heart sounds. No murmur heard.    No friction rub. No gallop.  Pulmonary:     Effort: Pulmonary effort is normal.     Breath sounds: Normal breath sounds. No wheezing, rhonchi or rales.  Musculoskeletal:     Cervical back: Neck supple.  Skin:    General: Skin is warm.     Findings: No rash.  Neurological:     Mental Status: He is alert and oriented to person, place, and time.  Psychiatric:        Behavior: Behavior normal.   The plan was reviewed with the patient/family, and all questions/concerned were addressed.  It was my pleasure to see Camren today and participate in his care. Please feel free to contact me with any questions or concerns.  Sincerely,  Rexene Alberts, DO Allergy & Immunology  [facility] office: [place] office: [phone]

## 2022-09-18 ENCOUNTER — Other Ambulatory Visit: Payer: Self-pay

## 2022-09-18 ENCOUNTER — Ambulatory Visit: Payer: Medicare HMO | Admitting: Allergy

## 2022-09-18 ENCOUNTER — Encounter: Payer: Self-pay | Admitting: Allergy

## 2022-09-18 VITALS — BP 120/88 | HR 75 | Temp 98.3°F | Resp 20 | Ht 68.5 in | Wt 185.9 lb

## 2022-09-18 DIAGNOSIS — J329 Chronic sinusitis, unspecified: Secondary | ICD-10-CM | POA: Diagnosis not present

## 2022-09-18 DIAGNOSIS — J310 Chronic rhinitis: Secondary | ICD-10-CM

## 2022-09-18 DIAGNOSIS — H04123 Dry eye syndrome of bilateral lacrimal glands: Secondary | ICD-10-CM | POA: Diagnosis not present

## 2022-09-18 DIAGNOSIS — H40043 Steroid responder, bilateral: Secondary | ICD-10-CM | POA: Diagnosis not present

## 2022-09-18 MED ORDER — AZELASTINE HCL 0.1 % NA SOLN: 1.0000 | Freq: Two times a day (BID) | NASAL | 5 refills | Status: DC | PRN

## 2022-09-18 MED ORDER — FLUTICASONE PROPIONATE 50 MCG/ACT NA SUSP: 1.0000 | Freq: Two times a day (BID) | NASAL | 5 refills | Status: DC | PRN

## 2022-09-18 NOTE — Assessment & Plan Note (Signed)
Perennial rhinitis symptoms of the last 10+ years with 3 sinus infections last 6 months.  Had T&A over 40+ years ago.  Uses Benadryl as needed with good benefit.  No prior allergy testing. Today's skin testing showed: Negative to indoor/outdoor allergens.  He most likely has chronic non-allergic rhinitis. Use Flonase (fluticasone) nasal spray 1 spray per nostril twice a day as needed for nasal congestion.  Use azelastine nasal spray 1-2 sprays per nostril twice a day as needed for runny nose/drainage. Nasal saline spray (i.e., Simply Saline) or nasal saline lavage (i.e., NeilMed) is recommended as needed and prior to medicated nasal sprays. If still having issues - recommend ENT evaluation next to look at sinus anatomy.

## 2022-09-18 NOTE — Assessment & Plan Note (Signed)
3 sinus infections in the last 6 months. Keep track of infections and antibiotics use. If having more frequent infections then will get bloodwork to look at immune system next.

## 2022-09-18 NOTE — Patient Instructions (Addendum)
Today's skin testing showed: Negative to indoor/outdoor allergens.   Results given.  Rhinitis Keep track of infections and antibiotics use. Your most likely have non-allergic rhinitis. Use Flonase (fluticasone) nasal spray 1 spray per nostril twice a day as needed for nasal congestion.  Use azelastine nasal spray 1-2 sprays per nostril twice a day as needed for runny nose/drainage. Nasal saline spray (i.e., Simply Saline) or nasal saline lavage (i.e., NeilMed) is recommended as needed and prior to medicated nasal sprays. If still having issues - recommend ENT evaluation next.  If you have other frequent infections let me know and will get bloodwork to look at your immune system.   Follow up if needed.   Buffered Isotonic Saline Irrigations:  Goal: When you irrigate with the isotonic saline (salt water) it washes mucous and other debris from your nose that could be contributing to your nasal symptoms.   Recipe: Obtain 1 quart jar that is clean Fill with clean (bottled, boiled or distilled) water Add 1-2 heaping teaspoons of salt without iodine If the solution with 2 teaspoons of salt is too strong, adjust the amount down until better tolerated Add 1 teaspoon of Arm & Hammer baking soda (pure bicarbonate) Mix ingredients together and store at room temperature and discard after 1 week * Alternatively you can buy pre made salt packets for the NeilMed bottle or there          are other over the counter brands available  Instructions: Warm  cup of the solution in the microwave if desired but be careful not to overheat as this will burn the inside of your nose Stand over a sink (or do it while you shower) and squirt the solution into one side of your nose aiming towards the back of your head Sometimes saying "coca cola" while irrigating can be helpful to prevent fluid from going down your throat  The solution will travel to the back of your nose and then come out the other side Perform  this again on the other side Try to do this twice a day If you are using a nasal spray in addition to the irrigation, irrigate first and then use the topical nasal spray otherwise you will wash the nasal spray out of your nose

## 2022-09-19 ENCOUNTER — Ambulatory Visit
Admission: RE | Admit: 2022-09-19 | Discharge: 2022-09-19 | Disposition: A | Discharge status: Home / Self Care | Payer: Medicare HMO | Source: Ambulatory Visit | Attending: Physician Assistant | Admitting: Physician Assistant

## 2022-09-19 DIAGNOSIS — K460 Unspecified abdominal hernia with obstruction, without gangrene: Secondary | ICD-10-CM

## 2022-09-19 DIAGNOSIS — R109 Unspecified abdominal pain: Secondary | ICD-10-CM | POA: Diagnosis not present

## 2022-09-19 DIAGNOSIS — N200 Calculus of kidney: Secondary | ICD-10-CM | POA: Diagnosis not present

## 2022-09-21 DIAGNOSIS — L570 Actinic keratosis: Secondary | ICD-10-CM | POA: Diagnosis not present

## 2022-09-22 ENCOUNTER — Other Ambulatory Visit: Payer: Self-pay | Admitting: Physician Assistant

## 2022-09-22 DIAGNOSIS — I2510 Atherosclerotic heart disease of native coronary artery without angina pectoris: Secondary | ICD-10-CM

## 2022-09-29 ENCOUNTER — Ambulatory Visit
Admission: RE | Admit: 2022-09-29 | Discharge: 2022-09-29 | Disposition: A | Discharge status: Home / Self Care | Payer: Medicare HMO | Source: Ambulatory Visit | Attending: Physician Assistant | Admitting: Physician Assistant

## 2022-09-29 DIAGNOSIS — I2510 Atherosclerotic heart disease of native coronary artery without angina pectoris: Secondary | ICD-10-CM

## 2022-09-29 DIAGNOSIS — I700 Atherosclerosis of aorta: Secondary | ICD-10-CM | POA: Diagnosis not present

## 2022-10-03 ENCOUNTER — Other Ambulatory Visit: Payer: Medicare HMO

## 2022-10-05 DIAGNOSIS — I7121 Aneurysm of the ascending aorta, without rupture: Secondary | ICD-10-CM | POA: Diagnosis not present

## 2022-10-05 DIAGNOSIS — I700 Atherosclerosis of aorta: Secondary | ICD-10-CM | POA: Diagnosis not present

## 2022-10-05 DIAGNOSIS — E782 Mixed hyperlipidemia: Secondary | ICD-10-CM | POA: Diagnosis not present

## 2022-10-05 DIAGNOSIS — Z136 Encounter for screening for cardiovascular disorders: Secondary | ICD-10-CM | POA: Diagnosis not present

## 2022-10-05 DIAGNOSIS — Z9181 History of falling: Secondary | ICD-10-CM | POA: Diagnosis not present

## 2022-10-05 DIAGNOSIS — I2510 Atherosclerotic heart disease of native coronary artery without angina pectoris: Secondary | ICD-10-CM | POA: Diagnosis not present

## 2022-10-05 DIAGNOSIS — Z0000 Encounter for general adult medical examination without abnormal findings: Secondary | ICD-10-CM | POA: Diagnosis not present

## 2022-10-12 ENCOUNTER — Encounter: Payer: Self-pay | Admitting: Internal Medicine

## 2022-10-12 ENCOUNTER — Ambulatory Visit: Payer: Medicare HMO | Admitting: Internal Medicine

## 2022-10-12 VITALS — BP 138/95 | HR 56 | Ht 68.5 in | Wt 190.4 lb

## 2022-10-12 DIAGNOSIS — I10 Essential (primary) hypertension: Secondary | ICD-10-CM | POA: Diagnosis not present

## 2022-10-12 DIAGNOSIS — I77810 Thoracic aortic ectasia: Secondary | ICD-10-CM

## 2022-10-12 DIAGNOSIS — R931 Abnormal findings on diagnostic imaging of heart and coronary circulation: Secondary | ICD-10-CM

## 2022-10-12 DIAGNOSIS — I2510 Atherosclerotic heart disease of native coronary artery without angina pectoris: Secondary | ICD-10-CM

## 2022-10-12 DIAGNOSIS — E782 Mixed hyperlipidemia: Secondary | ICD-10-CM | POA: Diagnosis not present

## 2022-10-12 NOTE — Progress Notes (Signed)
Primary Physician/Referring:  Olen Cordial, PA  Patient ID: Hunter Collins, male    DOB: 1946-04-10, 77 y.o.   MRN: [id number]  Chief Complaint  Patient presents with   Coronary Artery Disease   New Patient (Initial Visit)   HPI:    Hunter Collins  is a 77 y.o. male with past medical history significant for hyperlipidemia who is here to establish care with cardiology due to elevated coronary calcium score.  His score was 1313 which places him in the 77th percentile of risk for his age group.  Patient denies any and all symptoms with activity.  He has completed 68 marathons in his life and he would like to start training for 1 more however his primary would like cardiac clearance prior to this.  Patient is very active he is always running or walking multiple miles a day.  He still works full-time.  He denies chest pain, shortness of breath, palpitations, diaphoresis, syncope, edema, orthopnea, PND.  His blood pressure is generally on the lower side and he does not take any medication to maintain his blood pressure.  Today he is a little bit nervous however he states that he has no cardiac history whatsoever.  He is bradycardic at baseline but that is because he exercises so much and his resting heart rate is in the 50s due to this.  Past Medical History:  Diagnosis Date   Allergy    Bradycardia    " MY HEART RATE IS USUALLY AROUD 38 - I EXERCISE A LOT"   Coronary artery disease    Inguinal hernia    RIGHT   Past Surgical History:  Procedure Laterality Date   ADENOIDECTOMY     CATARACT EXTRACTION  10/19/2020   CLOSED REDUCTION ELBOW DISLOCATION     L ELBOW   INGUINAL HERNIA REPAIR Right 08/13/2015   Procedure: LAPAROSCOPIC RIGHT INGUINAL HERNIA WITH MESH;  Surgeon: Coralie Keens, MD;  Location: [facility];  Service: General;  Laterality: Right;   INSERTION OF MESH Right 08/13/2015   Procedure: INSERTION OF MESH;  Surgeon: Coralie Keens, MD;  Location: [facility];  Service: General;   Laterality: Right;   KNEE ARTHROSCOPY  2002   RT KNEE   TONSILLECTOMY AND ADENOIDECTOMY  1960   Family History  Problem Relation Age of Onset   Colon cancer Neg Hx     Social History   Tobacco Use   Smoking status: Never    Passive exposure: Never   Smokeless tobacco: Former    Types: Chew    Quit date: 2014  Substance Use Topics   Alcohol use: Yes    Alcohol/week: 3.0 standard drinks of alcohol    Types: 3 Shots of liquor per week    Comment: weekends   Marital Status: Married  ROS  Review of Systems  Cardiovascular:  Negative for chest pain, claudication, cyanosis, dyspnea on exertion, irregular heartbeat, leg swelling, near-syncope, orthopnea, palpitations and paroxysmal nocturnal dyspnea.   Objective  Blood pressure (!) 138/95, pulse (!) 56, height 5' 8.5" (1.74 m), weight 190 lb 6.4 oz (86.4 kg), SpO2 95 %. Body mass index is 28.53 kg/m.     10/12/2022   11:06 AM 10/12/2022   10:59 AM 09/18/2022    9:36 AM  Vitals with BMI  Height  5' 8.5" 5' 8.5"  Weight  190 lbs 6 oz 185 lbs 14 oz  BMI  [id number]  Systolic 0000000 [id number] [id number]  Diastolic 95 95 88  Pulse  56 55 75     Physical Exam Vitals reviewed.  HENT:     Head: Normocephalic and atraumatic.  Neck:     Vascular: No carotid bruit.  Cardiovascular:     Rate and Rhythm: Normal rate and regular rhythm.     Pulses: Normal pulses.     Heart sounds: Normal heart sounds. No murmur heard. Pulmonary:     Effort: Pulmonary effort is normal.     Breath sounds: Normal breath sounds.  Abdominal:     General: Bowel sounds are normal.  Musculoskeletal:     Right lower leg: No edema.     Left lower leg: No edema.  Skin:    General: Skin is warm and dry.  Neurological:     Mental Status: He is alert.     Medications and allergies  No Known Allergies   Medication list after today's encounter   Current Outpatient Medications:    diphenhydrAMINE (BENADRYL) 25 mg capsule, Take 25 mg by mouth every 6 (six) hours as  needed., Disp: , Rfl:    fluticasone (FLONASE) 50 MCG/ACT nasal spray, Place 1 spray into both nostrils 2 (two) times daily as needed (nasal congestion)., Disp: 16 g, Rfl: 5   glucosamine-chondroitin 500-400 MG tablet, Take 1 tablet by mouth daily., Disp: , Rfl:    ibuprofen (ADVIL,MOTRIN) 200 MG tablet, Take 800 mg by mouth every 6 (six) hours as needed for moderate pain., Disp: , Rfl:    Multiple Vitamin (MULTIVITAMIN WITH MINERALS) TABS tablet, Take 1 tablet by mouth daily., Disp: , Rfl:    Omega-3 Fatty Acids (FISH OIL) 1000 MG CAPS, Take by mouth. Take One Capsule Daily., Disp: , Rfl:    rosuvastatin (CRESTOR) 20 MG tablet, Take 1 tablet (20 mg total) by mouth daily. SCHEDULE ANNUAL VISIT FOR FUTURE REFILLS, Disp: 30 tablet, Rfl: 0  Laboratory examination:   Lab Results  Component Value Date   NA 137 07/23/2019   K 4.9 07/23/2019   CO2 27 07/23/2019   GLUCOSE 88 07/23/2019   BUN 23 07/23/2019   CREATININE 0.97 07/23/2019   CALCIUM 9.8 07/23/2019   GFRNONAA >60 11/20/2009       Latest Ref Rng & Units 02/28/2021   11:07 AM 05/31/2020   10:50 AM 11/13/2019    9:46 AM  CMP  Total Protein 6.0 - 8.3 g/dL 6.8  6.7  6.7   Total Bilirubin 0.2 - 1.2 mg/dL 0.7  0.8  0.8   Alkaline Phos 39 - 117 U/L 57   55   AST 0 - 37 U/L 20  20  19    ALT 0 - 53 U/L 20  21  21        Latest Ref Rng & Units 07/23/2019   10:27 AM 07/13/2017   11:03 AM 07/07/2016    9:48 AM  CBC  WBC 4.0 - 10.5 K/uL 4.4  3.9  5.0   Hemoglobin 13.0 - 17.0 g/dL 14.8  14.7  15.5   Hematocrit 39.0 - 52.0 % 44.0  43.6  45.2   Platelets 150.0 - 400.0 K/uL 194.0  197.0  215.0     Lipid Panel No results for input(s): "CHOL", "TRIG", "LDLCALC", "VLDL", "HDL", "CHOLHDL", "LDLDIRECT" in the last 8760 hours.  HEMOGLOBIN A1C No results found for: "HGBA1C", "MPG" TSH No results for input(s): "TSH" in the last 8760 hours.  External labs:  Labs 10/05/2022: Hemoglobin 14.6 hematocrit 43.8 platelets 180 Cholesterol 169  triglycerides 83 HDL 52 LDL 101 Glucose 92  BUN 21 creatinine 1.19 potassium 4.7   Radiology:    Cardiac Studies:   09/29/2022 CORONARY CALCIUM SCORES: Left Main: 172 LAD: 571 LCx: 24 RCA: 546 Total Agatston Score: 1,313 MESA database percentile: 77th  AORTA MEASUREMENTS: Ascending Aorta: 4.9 cm Descending Aorta:3.0 cm     EKG:   10/12/2022: Sinus Bradycardia, rate 51 bpm. Nonspecific T-abnormality.  Assessment     ICD-10-CM   1. Coronary artery disease involving native coronary artery of native heart without angina pectoris  I25.10 EKG 12-Lead    PCV ECHOCARDIOGRAM COMPLETE    PCV MYOCARDIAL PERFUSION WO LEXISCAN    2. Essential hypertension  I10 PCV ECHOCARDIOGRAM COMPLETE    PCV MYOCARDIAL PERFUSION WO LEXISCAN    3. Mixed hyperlipidemia  E78.2 PCV ECHOCARDIOGRAM COMPLETE    PCV MYOCARDIAL PERFUSION WO LEXISCAN    4. Elevated coronary artery calcium score  R93.1 PCV ECHOCARDIOGRAM COMPLETE    PCV MYOCARDIAL PERFUSION WO LEXISCAN   Agaston score = 1313    5. Ascending aorta dilatation (HCC)  I77.810        Orders Placed This Encounter  Procedures   PCV MYOCARDIAL PERFUSION WO LEXISCAN    Standing Status:   Future    Standing Expiration Date:   12/12/2022   EKG 12-Lead   PCV ECHOCARDIOGRAM COMPLETE    Standing Status:   Future    Standing Expiration Date:   10/12/2023    No orders of the defined types were placed in this encounter.   Medications Discontinued During This Encounter  Medication Reason   azelastine (ASTELIN) 0.1 % nasal spray Completed Course   latanoprost (XALATAN) 0.005 % ophthalmic solution Completed Course   RESTASIS MULTIDOSE 0.05 % ophthalmic emulsion Completed Course   prednisoLONE sodium phosphate (INFLAMASE FORTE) 1 % ophthalmic solution Completed Course     Recommendations:   ONIX BARBERI is a 77 y.o.  male with elevated CACS   Coronary artery disease involving native coronary artery of native heart without angina  pectoris Elevated coronary artery calcium score Ascending aorta dilatation (HCC) Echocardiogram and stress test ordered Will monitor aortic aneurysm with annual echocardiogram Patient has completed 14 marathons and would like to do one more but his PCP is requesting cardiac clearance for this Total Agatston Score: 1,313 MESA database percentile: 77th    Essential hypertension Continue current cardiac medications. Encourage low-sodium diet, less than 2000 mg daily. Relatively well controlled without medications. Will discuss starting anti-hypertensive next visit but patient states his BP is very well controlled normally. Follow-up in 1-2 months or sooner if needed.   Mixed hyperlipidemia Continue Crestor 20 mg daily    Floydene Flock, DO, [facility]  10/12/2022, 11:41 AM Office: [phone] Pager: [phone]

## 2022-11-06 ENCOUNTER — Ambulatory Visit: Payer: Medicare HMO

## 2022-11-06 DIAGNOSIS — E782 Mixed hyperlipidemia: Secondary | ICD-10-CM

## 2022-11-06 DIAGNOSIS — I2510 Atherosclerotic heart disease of native coronary artery without angina pectoris: Secondary | ICD-10-CM | POA: Diagnosis not present

## 2022-11-06 DIAGNOSIS — I10 Essential (primary) hypertension: Secondary | ICD-10-CM

## 2022-11-06 DIAGNOSIS — R931 Abnormal findings on diagnostic imaging of heart and coronary circulation: Secondary | ICD-10-CM | POA: Diagnosis not present

## 2022-11-09 ENCOUNTER — Ambulatory Visit: Payer: Medicare HMO

## 2022-11-09 DIAGNOSIS — I10 Essential (primary) hypertension: Secondary | ICD-10-CM | POA: Diagnosis not present

## 2022-11-09 DIAGNOSIS — I2510 Atherosclerotic heart disease of native coronary artery without angina pectoris: Secondary | ICD-10-CM | POA: Diagnosis not present

## 2022-11-09 DIAGNOSIS — R931 Abnormal findings on diagnostic imaging of heart and coronary circulation: Secondary | ICD-10-CM

## 2022-11-09 DIAGNOSIS — E782 Mixed hyperlipidemia: Secondary | ICD-10-CM

## 2022-11-23 ENCOUNTER — Ambulatory Visit: Payer: Medicare HMO | Admitting: Internal Medicine

## 2022-11-23 ENCOUNTER — Encounter: Payer: Self-pay | Admitting: Internal Medicine

## 2022-11-23 VITALS — BP 124/76 | HR 50 | Ht 68.5 in | Wt 184.4 lb

## 2022-11-23 DIAGNOSIS — R931 Abnormal findings on diagnostic imaging of heart and coronary circulation: Secondary | ICD-10-CM | POA: Diagnosis not present

## 2022-11-23 DIAGNOSIS — I10 Essential (primary) hypertension: Secondary | ICD-10-CM

## 2022-11-23 DIAGNOSIS — E782 Mixed hyperlipidemia: Secondary | ICD-10-CM

## 2022-11-23 DIAGNOSIS — I77810 Thoracic aortic ectasia: Secondary | ICD-10-CM

## 2022-11-23 NOTE — Progress Notes (Signed)
Primary Physician/Referring:  Ceasar Lund, PA  Patient ID: Hunter Collins, male    DOB: 10/21/1945, 77 y.o.   MRN: [id number]  Chief Complaint  Patient presents with   Coronary Artery Disease   Follow-up   Results   HPI:    Hunter Collins  is a 77 y.o. male with past medical history significant for hyperlipidemia who is here for a follow-up visit. He has been feeling well. No complaints or concerns today. He is happy his cardiac studies came back normal so he can participate in one last marathon. He is planning to walk most of it but he enjoys doing them. He will follow-up annually with echocardiogram and office visits. He denies chest pain, shortness of breath, palpitations, diaphoresis, syncope, edema, orthopnea, PND.    Past Medical History:  Diagnosis Date   Allergy    Bradycardia    " MY HEART RATE IS USUALLY AROUD 50 - I EXERCISE A LOT"   Coronary artery disease    Inguinal hernia    RIGHT   Past Surgical History:  Procedure Laterality Date   ADENOIDECTOMY     CATARACT EXTRACTION  10/19/2020   CLOSED REDUCTION ELBOW DISLOCATION     L ELBOW   INGUINAL HERNIA REPAIR Right 08/13/2015   Procedure: LAPAROSCOPIC RIGHT INGUINAL HERNIA WITH MESH;  Surgeon: Abigail Miyamoto, MD;  Location: [facility];  Service: General;  Laterality: Right;   INSERTION OF MESH Right 08/13/2015   Procedure: INSERTION OF MESH;  Surgeon: Abigail Miyamoto, MD;  Location: [facility];  Service: General;  Laterality: Right;   KNEE ARTHROSCOPY  2002   RT KNEE   TONSILLECTOMY AND ADENOIDECTOMY  1960   Family History  Problem Relation Age of Onset   Colon cancer Neg Hx     Social History   Tobacco Use   Smoking status: Never    Passive exposure: Never   Smokeless tobacco: Former    Types: Chew    Quit date: 2014  Substance Use Topics   Alcohol use: Yes    Alcohol/week: 3.0 standard drinks of alcohol    Types: 3 Shots of liquor per week    Comment: weekends   Marital Status: Married  ROS   Review of Systems  Cardiovascular:  Negative for chest pain, claudication, cyanosis, dyspnea on exertion, irregular heartbeat, leg swelling, near-syncope, orthopnea, palpitations and paroxysmal nocturnal dyspnea.   Objective  Blood pressure 124/76, pulse (!) 50, height 5' 8.5" (1.74 m), weight 184 lb 6.4 oz (83.6 kg), SpO2 97 %. Body mass index is 27.63 kg/m.     11/23/2022    9:57 AM 10/12/2022   11:06 AM 10/12/2022   10:59 AM  Vitals with BMI  Height 5' 8.5"  5' 8.5"  Weight 184 lbs 6 oz  190 lbs 6 oz  BMI 27.63  28.53  Systolic 124 138 191  Diastolic 76 95 95  Pulse 50 56 55     Physical Exam Vitals reviewed.  HENT:     Head: Normocephalic and atraumatic.  Neck:     Vascular: No carotid bruit.  Cardiovascular:     Rate and Rhythm: Normal rate and regular rhythm.     Pulses: Normal pulses.     Heart sounds: Normal heart sounds. No murmur heard. Pulmonary:     Effort: Pulmonary effort is normal.     Breath sounds: Normal breath sounds.  Abdominal:     General: Bowel sounds are normal.  Musculoskeletal:  Right lower leg: No edema.     Left lower leg: No edema.  Skin:    General: Skin is warm and dry.  Neurological:     Mental Status: He is alert.     Medications and allergies  No Known Allergies   Medication list after today's encounter   Current Outpatient Medications:    diphenhydrAMINE (BENADRYL) 25 mg capsule, Take 25 mg by mouth every 6 (six) hours as needed., Disp: , Rfl:    fluticasone (FLONASE) 50 MCG/ACT nasal spray, Place 1 spray into both nostrils 2 (two) times daily as needed (nasal congestion)., Disp: 16 g, Rfl: 5   glucosamine-chondroitin 500-400 MG tablet, Take 1 tablet by mouth daily., Disp: , Rfl:    ibuprofen (ADVIL,MOTRIN) 200 MG tablet, Take 800 mg by mouth every 6 (six) hours as needed for moderate pain., Disp: , Rfl:    Multiple Vitamin (MULTIVITAMIN WITH MINERALS) TABS tablet, Take 1 tablet by mouth daily., Disp: , Rfl:    Omega-3 Fatty  Acids (FISH OIL) 1000 MG CAPS, Take by mouth. Take One Capsule Daily., Disp: , Rfl:    rosuvastatin (CRESTOR) 20 MG tablet, Take 1 tablet (20 mg total) by mouth daily. SCHEDULE ANNUAL VISIT FOR FUTURE REFILLS, Disp: 30 tablet, Rfl: 0  Laboratory examination:   Lab Results  Component Value Date   NA 137 07/23/2019   K 4.9 07/23/2019   CO2 27 07/23/2019   GLUCOSE 88 07/23/2019   BUN 23 07/23/2019   CREATININE 0.97 07/23/2019   CALCIUM 9.8 07/23/2019   GFRNONAA >60 11/20/2009       Latest Ref Rng & Units 02/28/2021   11:07 AM 05/31/2020   10:50 AM 11/13/2019    9:46 AM  CMP  Total Protein 6.0 - 8.3 g/dL 6.8  6.7  6.7   Total Bilirubin 0.2 - 1.2 mg/dL 0.7  0.8  0.8   Alkaline Phos 39 - 117 U/L 57   55   AST 0 - 37 U/L 20  20  19    ALT 0 - 53 U/L 20  21  21        Latest Ref Rng & Units 07/23/2019   10:27 AM 07/13/2017   11:03 AM 07/07/2016    9:48 AM  CBC  WBC 4.0 - 10.5 K/uL 4.4  3.9  5.0   Hemoglobin 13.0 - 17.0 g/dL 16.1  09.6  04.5   Hematocrit 39.0 - 52.0 % 44.0  43.6  45.2   Platelets 150.0 - 400.0 K/uL 194.0  197.0  215.0     Lipid Panel No results for input(s): "CHOL", "TRIG", "LDLCALC", "VLDL", "HDL", "CHOLHDL", "LDLDIRECT" in the last 8760 hours.  HEMOGLOBIN A1C No results found for: "HGBA1C", "MPG" TSH No results for input(s): "TSH" in the last 8760 hours.  External labs:  Labs 10/05/2022: Hemoglobin 14.6 hematocrit 43.8 platelets 180 Cholesterol 169 triglycerides 83 HDL 52 LDL 101 Glucose 92 BUN 21 creatinine 1.19 potassium 4.7   Radiology:    Cardiac Studies:   09/29/2022 CORONARY CALCIUM SCORES: Left Main: 172 LAD: 571 LCx: 24 RCA: 546 Total Agatston Score: 1,313 MESA database percentile: 77th  AORTA MEASUREMENTS: Ascending Aorta: 4.9 cm Descending Aorta:3.0 cm   Exercise nuclear stress test 11/06/2022 Myocardial perfusion is normal. Overall LV systolic function is normal without regional wall motion abnormalities. Stress LV EF: 60%. Low  risk study. Normal ECG stress. The patient exercised for 7 minutes and 0 seconds of a Bruce protocol, achieving approximately 9.75 METs and 96% MPHR. Peak  EKG/ECG demonstrated sinus tachycardia. The heart rate response was normal. The blood pressure response was normal. No previous exam available for comparison.   Echocardiogram 11/09/2022: Normal LV systolic function with visual EF 55-60%. Left ventricle cavity is normal in size. Mild concentric hypertrophy of the left ventricle. Normal global wall motion. Doppler evidence of grade I (impaired) diastolic dysfunction, normal LAP. Calculated EF 53%. Left atrial cavity is mildly dilated at 36.4 ml/m^2. Trileaflet aortic valve.  Mild (Grade I) aortic regurgitation. Mild aortic valve leaflet calcification. Structurally normal mitral valve.  Mild (Grade I) mitral regurgitation. Structurally normal tricuspid valve.  Mild tricuspid regurgitation. No evidence of pulmonary hypertension. The aortic root is mildly dilated at 3.8 cm. Mildly dilated ascending aorta at 4.0 cm. IVC is dilated with respiratory variation.  Repeat echo in 1 year   EKG:   10/12/2022: Sinus Bradycardia, rate 51 bpm. Nonspecific T-abnormality.  Assessment     ICD-10-CM   1. Ascending aorta dilatation (HCC)  I77.810 PCV ECHOCARDIOGRAM COMPLETE    2. Essential hypertension  I10     3. Mixed hyperlipidemia  E78.2     4. Elevated coronary artery calcium score  R93.1        Orders Placed This Encounter  Procedures   PCV ECHOCARDIOGRAM COMPLETE    Standing Status:   Future    Standing Expiration Date:   11/23/2023    No orders of the defined types were placed in this encounter.   There are no discontinued medications.    Recommendations:   Hunter Collins is a 77 y.o.  male with elevated CACS    Elevated coronary artery calcium score Ascending aorta dilatation (HCC) Echocardiogram and stress test did not show any ischemia Will monitor aortic aneurysm  with annual echocardiogram Patient has completed 32 marathons and would like to do one more but his PCP is requesting cardiac clearance for this Total Agatston Score: 1,313 MESA database percentile: 77th  Given his nuclear stress test and echocardiogram, patient ok to proceed with marathon   Essential hypertension Continue current cardiac medications. Encourage low-sodium diet, less than 2000 mg daily. Relatively well controlled without medications. Will discuss starting anti-hypertensive next visit but patient states his BP is very well controlled normally. Follow-up in 6-12 months or sooner if needed.   Mixed hyperlipidemia Continue Crestor 20 mg daily    Clotilde Dieter, DO, [facility]  11/23/2022, 10:10 AM Office: [phone] Pager: [phone]

## 2022-11-27 DIAGNOSIS — Z08 Encounter for follow-up examination after completed treatment for malignant neoplasm: Secondary | ICD-10-CM | POA: Diagnosis not present

## 2022-11-27 DIAGNOSIS — L821 Other seborrheic keratosis: Secondary | ICD-10-CM | POA: Diagnosis not present

## 2022-11-27 DIAGNOSIS — L570 Actinic keratosis: Secondary | ICD-10-CM | POA: Diagnosis not present

## 2022-11-27 DIAGNOSIS — Z85828 Personal history of other malignant neoplasm of skin: Secondary | ICD-10-CM | POA: Diagnosis not present

## 2022-11-27 DIAGNOSIS — D225 Melanocytic nevi of trunk: Secondary | ICD-10-CM | POA: Diagnosis not present

## 2022-11-27 DIAGNOSIS — L814 Other melanin hyperpigmentation: Secondary | ICD-10-CM | POA: Diagnosis not present

## 2023-01-08 ENCOUNTER — Encounter: Payer: Self-pay | Admitting: Podiatry

## 2023-01-08 ENCOUNTER — Ambulatory Visit: Payer: Medicare HMO | Admitting: Podiatry

## 2023-01-08 DIAGNOSIS — B351 Tinea unguium: Secondary | ICD-10-CM

## 2023-01-08 DIAGNOSIS — L84 Corns and callosities: Secondary | ICD-10-CM

## 2023-01-08 DIAGNOSIS — M79674 Pain in right toe(s): Secondary | ICD-10-CM

## 2023-01-08 DIAGNOSIS — M79675 Pain in left toe(s): Secondary | ICD-10-CM

## 2023-01-08 NOTE — Progress Notes (Signed)
This patient returns to the office for evaluation and treatment of thick painful corn on the fifth toe right foot..  This patient is unable to trim his corn since the patient cannot reach his feet.  Patient says the corn is  painful walking and wearing his shoes.  He returns for preventive foot care services.  General Appearance  Alert, conversant and in no acute stress.  Vascular  Dorsalis pedis and posterior tibial  pulses are palpable  bilaterally.  Capillary return is within normal limits  bilaterally. Temperature is within normal limits  bilaterally.  Neurologic  Senn-Weinstein monofilament wire test within normal limits  bilaterally. Muscle power within normal limits bilaterally.  Nails Thick disfigured discolored nails with subungual debris second toenail  and third toenail right foot.. No evidence of bacterial infection or drainage bilaterally.  Orthopedic  No limitations of motion  feet .  No crepitus or effusions noted.  No bony pathology or digital deformities noted.  Skin  normotropic skin with no porokeratosis noted bilaterally.  No signs of infections or ulcers noted.   Heloma durum fifth toe right foot. Hallux limitus 1st MPJ left foot  Onychomycosis  Debridement  of heloma durum fifth toe right foot. Debride 2,3 right foot with nail nipper followed by dremel tool.  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

## 2023-03-13 ENCOUNTER — Other Ambulatory Visit: Payer: Self-pay | Admitting: Allergy

## 2023-03-30 ENCOUNTER — Other Ambulatory Visit: Payer: Self-pay | Admitting: Oncology

## 2023-03-30 DIAGNOSIS — Z006 Encounter for examination for normal comparison and control in clinical research program: Secondary | ICD-10-CM

## 2023-04-11 DIAGNOSIS — H40043 Steroid responder, bilateral: Secondary | ICD-10-CM | POA: Diagnosis not present

## 2023-05-28 ENCOUNTER — Encounter: Payer: Self-pay | Admitting: Podiatry

## 2023-05-28 ENCOUNTER — Ambulatory Visit: Payer: Medicare HMO | Admitting: Podiatry

## 2023-05-28 DIAGNOSIS — Z08 Encounter for follow-up examination after completed treatment for malignant neoplasm: Secondary | ICD-10-CM | POA: Diagnosis not present

## 2023-05-28 DIAGNOSIS — L814 Other melanin hyperpigmentation: Secondary | ICD-10-CM | POA: Diagnosis not present

## 2023-05-28 DIAGNOSIS — L84 Corns and callosities: Secondary | ICD-10-CM

## 2023-05-28 DIAGNOSIS — Z85828 Personal history of other malignant neoplasm of skin: Secondary | ICD-10-CM | POA: Diagnosis not present

## 2023-05-28 DIAGNOSIS — L570 Actinic keratosis: Secondary | ICD-10-CM | POA: Diagnosis not present

## 2023-05-28 DIAGNOSIS — M79675 Pain in left toe(s): Secondary | ICD-10-CM

## 2023-05-28 DIAGNOSIS — D225 Melanocytic nevi of trunk: Secondary | ICD-10-CM | POA: Diagnosis not present

## 2023-05-28 DIAGNOSIS — H524 Presbyopia: Secondary | ICD-10-CM | POA: Diagnosis not present

## 2023-05-28 DIAGNOSIS — M2041 Other hammer toe(s) (acquired), right foot: Secondary | ICD-10-CM

## 2023-05-28 DIAGNOSIS — L821 Other seborrheic keratosis: Secondary | ICD-10-CM | POA: Diagnosis not present

## 2023-05-28 DIAGNOSIS — M79674 Pain in right toe(s): Secondary | ICD-10-CM

## 2023-05-28 DIAGNOSIS — B351 Tinea unguium: Secondary | ICD-10-CM

## 2023-05-28 NOTE — Progress Notes (Signed)
This patient returns to the office for evaluation and treatment of thick painful corn on the fifth toe right foot..  This patient is unable to trim his corn since the patient cannot reach his feet.  Patient says the corn is  painful walking and wearing his shoes.  He returns for preventive foot care services.  General Appearance  Alert, conversant and in no acute stress.  Vascular  Dorsalis pedis and posterior tibial  pulses are palpable  bilaterally.  Capillary return is within normal limits  bilaterally. Temperature is within normal limits  bilaterally.  Neurologic  Senn-Weinstein monofilament wire test within normal limits  bilaterally. Muscle power within normal limits bilaterally.  Nails Thick disfigured discolored nails with subungual debris second toenail  and third toenail right foot.. No evidence of bacterial infection or drainage bilaterally.  Orthopedic  No limitations of motion  feet .  No crepitus or effusions noted.  No bony pathology or digital deformities noted.  Skin  normotropic skin with no porokeratosis noted bilaterally.  No signs of infections or ulcers noted.   Heloma durum fifth toe right foot. Hallux limitus 1st MPJ left foot  Onychomycosis  Debridement  of heloma durum fifth toe right foot. Debride 2,3 right foot with nail nipper followed by dremel tool.  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

## 2023-07-25 DIAGNOSIS — H6121 Impacted cerumen, right ear: Secondary | ICD-10-CM | POA: Diagnosis not present

## 2023-07-25 DIAGNOSIS — H6993 Unspecified Eustachian tube disorder, bilateral: Secondary | ICD-10-CM | POA: Diagnosis not present

## 2023-07-25 DIAGNOSIS — S00411A Abrasion of right ear, initial encounter: Secondary | ICD-10-CM | POA: Diagnosis not present

## 2023-09-17 ENCOUNTER — Other Ambulatory Visit: Payer: Self-pay | Admitting: Allergy

## 2023-09-18 ENCOUNTER — Telehealth: Payer: Self-pay | Admitting: Allergy

## 2023-09-18 MED ORDER — FLUTICASONE PROPIONATE 50 MCG/ACT NA SUSP: NASAL | 5 refills | Status: AC

## 2023-09-18 NOTE — Telephone Encounter (Signed)
 I called patient and informed refill of flonase has been sent.

## 2023-09-18 NOTE — Telephone Encounter (Signed)
 Patient called stating he needs a refill on Flonase sent to [facility] on [address].

## 2023-09-19 DIAGNOSIS — L570 Actinic keratosis: Secondary | ICD-10-CM | POA: Diagnosis not present

## 2023-09-19 DIAGNOSIS — L538 Other specified erythematous conditions: Secondary | ICD-10-CM | POA: Diagnosis not present

## 2023-09-19 DIAGNOSIS — L821 Other seborrheic keratosis: Secondary | ICD-10-CM | POA: Diagnosis not present

## 2023-09-19 DIAGNOSIS — L820 Inflamed seborrheic keratosis: Secondary | ICD-10-CM | POA: Diagnosis not present

## 2023-09-26 ENCOUNTER — Ambulatory Visit: Payer: Medicare HMO | Admitting: Podiatry

## 2023-10-08 DIAGNOSIS — Z961 Presence of intraocular lens: Secondary | ICD-10-CM | POA: Diagnosis not present

## 2023-10-08 DIAGNOSIS — H40043 Steroid responder, bilateral: Secondary | ICD-10-CM | POA: Diagnosis not present

## 2023-10-08 DIAGNOSIS — H524 Presbyopia: Secondary | ICD-10-CM | POA: Diagnosis not present

## 2023-10-08 DIAGNOSIS — H40053 Ocular hypertension, bilateral: Secondary | ICD-10-CM | POA: Diagnosis not present

## 2023-10-08 DIAGNOSIS — H04123 Dry eye syndrome of bilateral lacrimal glands: Secondary | ICD-10-CM | POA: Diagnosis not present

## 2023-10-27 DIAGNOSIS — U071 COVID-19: Secondary | ICD-10-CM | POA: Diagnosis not present

## 2023-11-19 ENCOUNTER — Ambulatory Visit (HOSPITAL_COMMUNITY): Payer: Medicare HMO

## 2023-11-19 ENCOUNTER — Other Ambulatory Visit: Payer: Medicare HMO

## 2023-11-19 ENCOUNTER — Telehealth (HOSPITAL_COMMUNITY): Payer: Self-pay | Admitting: Internal Medicine

## 2023-11-19 ENCOUNTER — Encounter (HOSPITAL_COMMUNITY): Payer: Self-pay

## 2023-11-19 NOTE — Telephone Encounter (Signed)
 Patient needs a new order for echocardiogram due to old one has expired and under MD no longer here. Thank you.

## 2023-11-19 NOTE — Telephone Encounter (Signed)
 Left voicemail to return call to office

## 2023-11-26 DIAGNOSIS — L570 Actinic keratosis: Secondary | ICD-10-CM | POA: Diagnosis not present

## 2023-12-03 ENCOUNTER — Ambulatory Visit: Payer: Self-pay | Admitting: Cardiology

## 2023-12-04 DIAGNOSIS — I2510 Atherosclerotic heart disease of native coronary artery without angina pectoris: Secondary | ICD-10-CM | POA: Diagnosis not present

## 2023-12-04 DIAGNOSIS — E782 Mixed hyperlipidemia: Secondary | ICD-10-CM | POA: Diagnosis not present

## 2023-12-04 DIAGNOSIS — Z0000 Encounter for general adult medical examination without abnormal findings: Secondary | ICD-10-CM | POA: Diagnosis not present

## 2023-12-04 DIAGNOSIS — I700 Atherosclerosis of aorta: Secondary | ICD-10-CM | POA: Diagnosis not present

## 2023-12-04 DIAGNOSIS — Z1331 Encounter for screening for depression: Secondary | ICD-10-CM | POA: Diagnosis not present

## 2023-12-04 DIAGNOSIS — M1990 Unspecified osteoarthritis, unspecified site: Secondary | ICD-10-CM | POA: Diagnosis not present

## 2023-12-04 DIAGNOSIS — I7121 Aneurysm of the ascending aorta, without rupture: Secondary | ICD-10-CM | POA: Diagnosis not present

## 2023-12-04 DIAGNOSIS — R109 Unspecified abdominal pain: Secondary | ICD-10-CM | POA: Diagnosis not present

## 2023-12-05 ENCOUNTER — Ambulatory Visit: Payer: Self-pay | Admitting: Cardiology

## 2023-12-05 ENCOUNTER — Ambulatory Visit (HOSPITAL_COMMUNITY): Attending: Cardiology

## 2023-12-05 ENCOUNTER — Other Ambulatory Visit (HOSPITAL_COMMUNITY): Payer: Self-pay | Admitting: Cardiology

## 2023-12-05 DIAGNOSIS — Z87891 Personal history of nicotine dependence: Secondary | ICD-10-CM | POA: Insufficient documentation

## 2023-12-05 DIAGNOSIS — I77810 Thoracic aortic ectasia: Secondary | ICD-10-CM | POA: Diagnosis not present

## 2023-12-05 DIAGNOSIS — I361 Nonrheumatic tricuspid (valve) insufficiency: Secondary | ICD-10-CM

## 2023-12-05 DIAGNOSIS — I071 Rheumatic tricuspid insufficiency: Secondary | ICD-10-CM | POA: Insufficient documentation

## 2023-12-05 DIAGNOSIS — I2510 Atherosclerotic heart disease of native coronary artery without angina pectoris: Secondary | ICD-10-CM | POA: Diagnosis not present

## 2023-12-05 DIAGNOSIS — E785 Hyperlipidemia, unspecified: Secondary | ICD-10-CM | POA: Insufficient documentation

## 2023-12-05 LAB — ECHOCARDIOGRAM COMPLETE
Area-P 1/2: 3.77 cm2
S' Lateral: 4 cm

## 2023-12-05 NOTE — Progress Notes (Signed)
 Not seen by me before.  Seen by Dr. Braxton Calico, and I suspect she had ordered this echocardiogram.  The result came to me.  Low normal heart pumping function.  Ascending aorta was at least 4.9 cm on CT cardiac scoring scan in 08/2022.  No recent renal function available, needed before ordering coronary CT angiogram.  Ideally, recommend that he be evaluated in office by any provider to discuss this and follow-up with the CT angiogram of aorta.  Pending that, okay to order a baseline BMP, if patient is okay with it.  Diagnosis: Ascending aorta aneurysm.  Thanks MJP

## 2023-12-18 ENCOUNTER — Encounter: Payer: Self-pay | Admitting: Cardiology

## 2023-12-18 ENCOUNTER — Other Ambulatory Visit: Payer: Self-pay

## 2023-12-18 ENCOUNTER — Ambulatory Visit: Attending: Cardiology | Admitting: Cardiology

## 2023-12-18 VITALS — BP 124/86 | HR 61 | Ht 67.5 in | Wt 186.0 lb

## 2023-12-18 DIAGNOSIS — I10 Essential (primary) hypertension: Secondary | ICD-10-CM

## 2023-12-18 DIAGNOSIS — R931 Abnormal findings on diagnostic imaging of heart and coronary circulation: Secondary | ICD-10-CM

## 2023-12-18 DIAGNOSIS — Z79899 Other long term (current) drug therapy: Secondary | ICD-10-CM | POA: Diagnosis not present

## 2023-12-18 DIAGNOSIS — E782 Mixed hyperlipidemia: Secondary | ICD-10-CM

## 2023-12-18 DIAGNOSIS — I77810 Thoracic aortic ectasia: Secondary | ICD-10-CM

## 2023-12-18 MED ORDER — ROSUVASTATIN CALCIUM 40 MG PO TABS: 40.0000 mg | ORAL_TABLET | Freq: Every day | ORAL | 3 refills | Status: DC

## 2023-12-18 NOTE — Patient Instructions (Addendum)
 Medication Instructions:  Please INCREASE your crestor  to 40 mg daily.  *If you need a refill on your cardiac medications before your next appointment, please call your pharmacy*  Lab Work: Please complete a FASTING lipid panel and an ALT at any [facility] in 6 weeks.   If you have labs (blood work) drawn today and your tests are completely normal, you will receive your results only by: MyChart Message (if you have MyChart) OR A paper copy in the mail If you have any lab test that is abnormal or we need to change your treatment, we will call you to review the results.  Testing/Procedures: Your physician has requested that you have cardiac CT. Cardiac computed tomography (CT) is a painless test that uses an x-ray machine to take clear, detailed pictures of your heart. For further information please visit [email or web address] Please follow instruction sheet as given.      Follow-Up: At [facility], you and your health needs are our priority.  As part of our continuing mission to provide you with exceptional heart care, our providers are all part of one team.  This team includes your primary Cardiologist (physician) and Advanced Practice Providers or APPs (Physician Assistants and Nurse Practitioners) who all work together to provide you with the care you need, when you need it.  Your next appointment:   1 year(s)  Provider:   Dr. Gaylyn Keas, MD

## 2023-12-18 NOTE — Progress Notes (Signed)
 Cardiology CONSULT Note    Date:  12/18/2023   ID:  HENRYK URSIN, DOB [birth year], MRN [id number]  PCP:  Ilsa Maltese, PA  Cardiologist:  Gaylyn Keas, MD   Chief Complaint  Patient presents with   New Patient (Initial Visit)    Coronary artery calcifications and ascending aortic aneurysm    Patient Profile: Hunter Collins is a 78 y.o. male who is being seen today for the evaluation of coronary calcifications and aortic aneurysm at the request of Turmel, Caleb P, PA.  History of Present Illness:  Hunter Collins is a 78 y.o. male who is being seen today for the evaluation of coronary artery calcifications and aortic aneurysm at the request of Turmel, Caleb P, PA.  This is an 78 year old male who was evaluated by cardiology a year ago and was found to have an elevated coronary calcium  score of 1313 which was 59 percentile for age, sex and race matched controls.  Due to the high score he underwent a nuclear stress test 11/06/2022 which showed no ischemia and EF 60%.  2D echo was done 12/05/2023 for dilated ascending aorta with a history of aortic root 4.1 mm and ascending aorta 4.7cm.  2D echo showed normal LV function EF 50 to 55% with G1 DD mild TR, mild calcification of the aortic valve with no aortic stenosis ascending aorta 46 mm.  He has now self-referred because he is concerned that he might need a coronary CTA.  He is here today for followup and is doing well.  He denies any chest pain or pressure, SOB, DOE, PND, orthopnea, LE edema, dizziness, palpitations or syncope. He is compliant with his meds and is tolerating meds with no SE.  He still works in Office manager at [employer] and walks 10,000 steps daily.  Cardiac Studies & Procedures   ______________________________________________________________________________________________   STRESS TESTS  PCV MYOCARDIAL PERFUSION WO LEXISCAN  11/06/2022  Narrative Exercise nuclear stress test 11/06/2022 Myocardial perfusion is  normal. Overall LV systolic function is normal without regional wall motion abnormalities. Stress LV EF: 60%. Low risk study. Normal ECG stress. The patient exercised for 7 minutes and 0 seconds of a Bruce protocol, achieving approximately 9.75 METs and 96% MPHR. Peak EKG/ECG demonstrated sinus tachycardia. The heart rate response was normal. The blood pressure response was normal. No previous exam available for comparison.   ECHOCARDIOGRAM  ECHOCARDIOGRAM COMPLETE 12/05/2023  Narrative ECHOCARDIOGRAM REPORT    Patient Name:   Hunter Collins [facility] Date of Exam: 12/05/2023 Medical Rec #:  [id number]      Height:       68.5 in Accession #:    [id number]     Weight:       184.4 lb Date of Birth:  [birth year]       BSA:          1.985 m Patient Age:    78 years       BP:           124/76 mmHg Patient Gender: M              HR:           78 bpm. Exam Location:  [address]  Procedure: 2D Echo, 3D Echo, Cardiac Doppler, Color Doppler and Strain Analysis (Both Spectral and Color Flow Doppler were utilized during procedure).  Indications:    I77.810 Ascending aorta dilatation  History:        Patient has prior history  of Echocardiogram examinations. CAD; Risk Factors:Former smokeless tobacco and Dyslipidemia. Previous echo LVEF 60% Caclulated EF 53% mild AI/MR mildly dilated aortic root 38 mm and ascending aorta 40 mm.  Sonographer:    Donnita Gales [facility], RDCS Referring Phys: [id number] [facility] J PATWARDHAN  IMPRESSIONS   1. Left ventricular ejection fraction, by estimation, is 50 to 55%. Left ventricular ejection fraction by 3D volume is 53 %. The left ventricle has low normal function. The left ventricle has no regional wall motion abnormalities. Left ventricular diastolic parameters are consistent with Grade I diastolic dysfunction (impaired relaxation). The average left ventricular global longitudinal strain is -14.9 %. The global longitudinal strain is abnormal. 2. Right ventricular systolic  function is normal. The right ventricular size is normal. 3. Left atrial size was mildly dilated. 4. The mitral valve is normal in structure. Trivial mitral valve regurgitation. No evidence of mitral stenosis. 5. The aortic valve is tricuspid. There is mild calcification of the aortic valve. There is mild thickening of the aortic valve. Aortic valve regurgitation is trivial. Aortic valve sclerosis is present, with no evidence of aortic valve stenosis. 6. Consider chest CTA to measure ascending aorta . Aortic dilatation noted. There is moderate dilatation of the ascending aorta, measuring 46 mm. 7. The inferior vena cava is normal in size with greater than 50% respiratory variability, suggesting right atrial pressure of 3 mmHg.  FINDINGS Left Ventricle: Left ventricular ejection fraction, by estimation, is 50 to 55%. Left ventricular ejection fraction by 3D volume is 53 %. The left ventricle has low normal function. The left ventricle has no regional wall motion abnormalities. The average left ventricular global longitudinal strain is -14.9 %. Strain was performed and the global longitudinal strain is abnormal. The left ventricular internal cavity size was normal in size. There is no left ventricular hypertrophy. Left ventricular diastolic parameters are consistent with Grade I diastolic dysfunction (impaired relaxation).  Right Ventricle: The right ventricular size is normal. No increase in right ventricular wall thickness. Right ventricular systolic function is normal.  Left Atrium: Left atrial size was mildly dilated.  Right Atrium: Right atrial size was normal in size.  Pericardium: There is no evidence of pericardial effusion.  Mitral Valve: The mitral valve is normal in structure. Trivial mitral valve regurgitation. No evidence of mitral valve stenosis.  Tricuspid Valve: The tricuspid valve is normal in structure. Tricuspid valve regurgitation is mild . No evidence of tricuspid  stenosis.  Aortic Valve: The aortic valve is tricuspid. There is mild calcification of the aortic valve. There is mild thickening of the aortic valve. Aortic valve regurgitation is trivial. Aortic valve sclerosis is present, with no evidence of aortic valve stenosis.  Pulmonic Valve: The pulmonic valve was normal in structure. Pulmonic valve regurgitation is not visualized. No evidence of pulmonic stenosis.  Aorta: Consider chest CTA to measure ascending aorta. Aortic dilatation noted. There is moderate dilatation of the ascending aorta, measuring 46 mm.  Venous: The inferior vena cava is normal in size with greater than 50% respiratory variability, suggesting right atrial pressure of 3 mmHg.  IAS/Shunts: No atrial level shunt detected by color flow Doppler.  Additional Comments: 3D was performed not requiring image post processing on an independent workstation and was normal.   LEFT VENTRICLE PLAX 2D LVIDd:         5.30 cm         Diastology LVIDs:         4.00 cm  LV e' medial:    4.46 cm/s LV PW:         0.80 cm         LV E/e' medial:  13.6 LV IVS:        0.90 cm         LV e' lateral:   9.14 cm/s LVOT diam:     2.40 cm         LV E/e' lateral: 6.7 LV SV:         82 LV SV Index:   41              2D Longitudinal LVOT Area:     4.52 cm        Strain 2D Strain GLS   -19.0 % (A4C): 2D Strain GLS   -12.3 % (A3C): 2D Strain GLS   -13.4 % (A2C): 2D Strain GLS   -14.9 % Avg:  3D Volume EF LV 3D EF:    Left ventricul ar ejection fraction by 3D volume is 53 %.  3D Volume EF: 3D EF:        53 % LV EDV:       196 ml LV ESV:       92 ml LV SV:        103 ml  RIGHT VENTRICLE RV Basal diam:  3.50 cm RV Mid diam:    3.70 cm RV S prime:     9.68 cm/s TAPSE (M-mode): 2.2 cm RVSP:           27.2 mmHg  LEFT ATRIUM             Index        RIGHT ATRIUM           Index LA diam:        4.30 cm 2.17 cm/m   RA Pressure: 3.00 mmHg LA Vol (A2C):   62.8 ml 31.63 ml/m   RA Area:     13.70 cm LA Vol (A4C):   84.3 ml 42.46 ml/m  RA Volume:   29.70 ml  14.96 ml/m LA Biplane Vol: 75.5 ml 38.03 ml/m AORTIC VALVE LVOT Vmax:   75.75 cm/s LVOT Vmean:  51.100 cm/s LVOT VTI:    0.182 m  AORTA Ao Root diam: 4.10 cm Ao Asc diam:  4.65 cm  MITRAL VALVE               TRICUSPID VALVE MV Area (PHT): 3.77 cm    TR Peak grad:   24.2 mmHg MV Decel Time: 201 msec    TR Vmax:        246.00 cm/s MV E velocity: 60.80 cm/s  Estimated RAP:  3.00 mmHg MV A velocity: 71.40 cm/s  RVSP:           27.2 mmHg MV E/A ratio:  0.85 SHUNTS Systemic VTI:  0.18 m Systemic Diam: 2.40 cm  Dorothye Gathers MD Electronically signed by Dorothye Gathers MD Signature Date/Time: 12/05/2023/2:16:10 PM    Final          ______________________________________________________________________________________________      Past Medical History:  Diagnosis Date   Allergy     Bradycardia    " MY HEART RATE IS USUALLY AROUD 50 - I EXERCISE A LOT"   Coronary artery disease    Inguinal hernia    RIGHT    Past Surgical History:  Procedure Laterality Date   ADENOIDECTOMY     CATARACT EXTRACTION  10/19/2020   CLOSED  REDUCTION ELBOW DISLOCATION     L ELBOW   INGUINAL HERNIA REPAIR Right 08/13/2015   Procedure: LAPAROSCOPIC RIGHT INGUINAL HERNIA WITH MESH;  Surgeon: Oza Blumenthal, MD;  Location: [facility];  Service: General;  Laterality: Right;   INSERTION OF MESH Right 08/13/2015   Procedure: INSERTION OF MESH;  Surgeon: Oza Blumenthal, MD;  Location: [facility];  Service: General;  Laterality: Right;   KNEE ARTHROSCOPY  2002   RT KNEE   TONSILLECTOMY AND ADENOIDECTOMY  1960    Current Medications: Current Meds  Medication Sig   diphenhydrAMINE (BENADRYL) 25 mg capsule Take 25 mg by mouth every 6 (six) hours as needed.   famotidine (PEPCID) 40 MG tablet Take 40 mg by mouth daily.   fluticasone  (FLONASE ) 50 MCG/ACT nasal spray SHAKE LIQUID AND USE 1 SPRAY IN EACH NOSTRIL TWICE DAILY AS  NEEDED FOR NASAL CONGESTION   glucosamine-chondroitin 500-400 MG tablet Take 1 tablet by mouth daily.   ibuprofen (ADVIL,MOTRIN) 200 MG tablet Take 800 mg by mouth every 6 (six) hours as needed for moderate pain.   latanoprost (XALATAN) 0.005 % ophthalmic solution 1 drop at bedtime.   Multiple Vitamin (MULTIVITAMIN WITH MINERALS) TABS tablet Take 1 tablet by mouth daily.   Omega-3 Fatty Acids (FISH OIL) 1000 MG CAPS Take by mouth. Take One Capsule Daily.   rosuvastatin  (CRESTOR ) 20 MG tablet Take 1 tablet (20 mg total) by mouth daily. SCHEDULE ANNUAL VISIT FOR FUTURE REFILLS    Allergies:   Patient has no known allergies.   Social History   Socioeconomic History   Marital status: Married    Spouse name: Not on file   Number of children: Not on file   Years of education: Not on file   Highest education level: Not on file  Occupational History   Not on file  Tobacco Use   Smoking status: Never    Passive exposure: Never   Smokeless tobacco: Former    Types: Chew    Quit date: 2014  Vaping Use   Vaping status: Never Used  Substance and Sexual Activity   Alcohol use: Yes    Alcohol/week: 3.0 standard drinks of alcohol    Types: 3 Shots of liquor per week    Comment: weekends   Drug use: No   Sexual activity: Not on file  Other Topics Concern   Not on file  Social History Narrative   Not on file   Social Drivers of Health   Financial Resource Strain: Not on file  Food Insecurity: Not on file  Transportation Needs: Not on file  Physical Activity: Not on file  Stress: Not on file  Social Connections: Not on file     Family History:  The patient's family history is not on file.   ROS:   Please see the history of present illness.    ROS All other systems reviewed and are negative.     12/11/2023    3:17 PM  PAD Screen  Previous PAD dx? No  Previous surgical procedure? No  Pain with walking? No  Feet/toe relief with dangling? No  Painful, non-healing ulcers? No   Extremities discolored? No       PHYSICAL EXAM:   VS:  BP 124/86   Pulse 61   Ht 5' 7.5" (1.715 m)   Wt 186 lb (84.4 kg)   SpO2 99%   BMI 28.70 kg/m    GEN: Well nourished, well developed, in no acute distress  HEENT: normal  Neck:  no JVD, carotid bruits, or masses Cardiac: RRR; no murmurs, rubs, or gallops,no edema.  Intact distal pulses bilaterally.  Respiratory:  clear to auscultation bilaterally, normal work of breathing GI: soft, nontender, nondistended, + BS MS: no deformity or atrophy  Skin: warm and dry, no rash Neuro:  Alert and Oriented x 3, Strength and sensation are intact Psych: euthymic mood, full affect  Wt Readings from Last 3 Encounters:  12/18/23 186 lb (84.4 kg)  11/23/22 184 lb 6.4 oz (83.6 kg)  10/12/22 190 lb 6.4 oz (86.4 kg)      Studies/Labs Reviewed:       Cardiac Studies & Procedures   ______________________________________________________________________________________________   STRESS TESTS  PCV MYOCARDIAL PERFUSION WO LEXISCAN  11/06/2022  Narrative Exercise nuclear stress test 11/06/2022 Myocardial perfusion is normal. Overall LV systolic function is normal without regional wall motion abnormalities. Stress LV EF: 60%. Low risk study. Normal ECG stress. The patient exercised for 7 minutes and 0 seconds of a Bruce protocol, achieving approximately 9.75 METs and 96% MPHR. Peak EKG/ECG demonstrated sinus tachycardia. The heart rate response was normal. The blood pressure response was normal. No previous exam available for comparison.   ECHOCARDIOGRAM  ECHOCARDIOGRAM COMPLETE 12/05/2023  Narrative ECHOCARDIOGRAM REPORT    Patient Name:   Hunter Collins [facility] Date of Exam: 12/05/2023 Medical Rec #:  [id number]      Height:       68.5 in Accession #:    [id number]     Weight:       184.4 lb Date of Birth:  [birth year]       BSA:          1.985 m Patient Age:    78 years       BP:           124/76 mmHg Patient Gender: M              HR:            78 bpm. Exam Location:  [address]  Procedure: 2D Echo, 3D Echo, Cardiac Doppler, Color Doppler and Strain Analysis (Both Spectral and Color Flow Doppler were utilized during procedure).  Indications:    I77.810 Ascending aorta dilatation  History:        Patient has prior history of Echocardiogram examinations. CAD; Risk Factors:Former smokeless tobacco and Dyslipidemia. Previous echo LVEF 60% Caclulated EF 53% mild AI/MR mildly dilated aortic root 38 mm and ascending aorta 40 mm.  Sonographer:    Donnita Gales [facility], RDCS Referring Phys: [id number] [facility] J PATWARDHAN  IMPRESSIONS   1. Left ventricular ejection fraction, by estimation, is 50 to 55%. Left ventricular ejection fraction by 3D volume is 53 %. The left ventricle has low normal function. The left ventricle has no regional wall motion abnormalities. Left ventricular diastolic parameters are consistent with Grade I diastolic dysfunction (impaired relaxation). The average left ventricular global longitudinal strain is -14.9 %. The global longitudinal strain is abnormal. 2. Right ventricular systolic function is normal. The right ventricular size is normal. 3. Left atrial size was mildly dilated. 4. The mitral valve is normal in structure. Trivial mitral valve regurgitation. No evidence of mitral stenosis. 5. The aortic valve is tricuspid. There is mild calcification of the aortic valve. There is mild thickening of the aortic valve. Aortic valve regurgitation is trivial. Aortic valve sclerosis is present, with no evidence of aortic valve stenosis. 6. Consider chest CTA to measure ascending aorta . Aortic dilatation noted. There is moderate dilatation of  the ascending aorta, measuring 46 mm. 7. The inferior vena cava is normal in size with greater than 50% respiratory variability, suggesting right atrial pressure of 3 mmHg.  FINDINGS Left Ventricle: Left ventricular ejection fraction, by estimation, is 50 to 55%. Left  ventricular ejection fraction by 3D volume is 53 %. The left ventricle has low normal function. The left ventricle has no regional wall motion abnormalities. The average left ventricular global longitudinal strain is -14.9 %. Strain was performed and the global longitudinal strain is abnormal. The left ventricular internal cavity size was normal in size. There is no left ventricular hypertrophy. Left ventricular diastolic parameters are consistent with Grade I diastolic dysfunction (impaired relaxation).  Right Ventricle: The right ventricular size is normal. No increase in right ventricular wall thickness. Right ventricular systolic function is normal.  Left Atrium: Left atrial size was mildly dilated.  Right Atrium: Right atrial size was normal in size.  Pericardium: There is no evidence of pericardial effusion.  Mitral Valve: The mitral valve is normal in structure. Trivial mitral valve regurgitation. No evidence of mitral valve stenosis.  Tricuspid Valve: The tricuspid valve is normal in structure. Tricuspid valve regurgitation is mild . No evidence of tricuspid stenosis.  Aortic Valve: The aortic valve is tricuspid. There is mild calcification of the aortic valve. There is mild thickening of the aortic valve. Aortic valve regurgitation is trivial. Aortic valve sclerosis is present, with no evidence of aortic valve stenosis.  Pulmonic Valve: The pulmonic valve was normal in structure. Pulmonic valve regurgitation is not visualized. No evidence of pulmonic stenosis.  Aorta: Consider chest CTA to measure ascending aorta. Aortic dilatation noted. There is moderate dilatation of the ascending aorta, measuring 46 mm.  Venous: The inferior vena cava is normal in size with greater than 50% respiratory variability, suggesting right atrial pressure of 3 mmHg.  IAS/Shunts: No atrial level shunt detected by color flow Doppler.  Additional Comments: 3D was performed not requiring image post  processing on an independent workstation and was normal.   LEFT VENTRICLE PLAX 2D LVIDd:         5.30 cm         Diastology LVIDs:         4.00 cm         LV e' medial:    4.46 cm/s LV PW:         0.80 cm         LV E/e' medial:  13.6 LV IVS:        0.90 cm         LV e' lateral:   9.14 cm/s LVOT diam:     2.40 cm         LV E/e' lateral: 6.7 LV SV:         82 LV SV Index:   41              2D Longitudinal LVOT Area:     4.52 cm        Strain 2D Strain GLS   -19.0 % (A4C): 2D Strain GLS   -12.3 % (A3C): 2D Strain GLS   -13.4 % (A2C): 2D Strain GLS   -14.9 % Avg:  3D Volume EF LV 3D EF:    Left ventricul ar ejection fraction by 3D volume is 53 %.  3D Volume EF: 3D EF:        53 % LV EDV:       196 ml LV ESV:  92 ml LV SV:        103 ml  RIGHT VENTRICLE RV Basal diam:  3.50 cm RV Mid diam:    3.70 cm RV S prime:     9.68 cm/s TAPSE (M-mode): 2.2 cm RVSP:           27.2 mmHg  LEFT ATRIUM             Index        RIGHT ATRIUM           Index LA diam:        4.30 cm 2.17 cm/m   RA Pressure: 3.00 mmHg LA Vol (A2C):   62.8 ml 31.63 ml/m  RA Area:     13.70 cm LA Vol (A4C):   84.3 ml 42.46 ml/m  RA Volume:   29.70 ml  14.96 ml/m LA Biplane Vol: 75.5 ml 38.03 ml/m AORTIC VALVE LVOT Vmax:   75.75 cm/s LVOT Vmean:  51.100 cm/s LVOT VTI:    0.182 m  AORTA Ao Root diam: 4.10 cm Ao Asc diam:  4.65 cm  MITRAL VALVE               TRICUSPID VALVE MV Area (PHT): 3.77 cm    TR Peak grad:   24.2 mmHg MV Decel Time: 201 msec    TR Vmax:        246.00 cm/s MV E velocity: 60.80 cm/s  Estimated RAP:  3.00 mmHg MV A velocity: 71.40 cm/s  RVSP:           27.2 mmHg MV E/A ratio:  0.85 SHUNTS Systemic VTI:  0.18 m Systemic Diam: 2.40 cm  Dorothye Gathers MD Electronically signed by Dorothye Gathers MD Signature Date/Time: 12/05/2023/2:16:10 PM    Final          ______________________________________________________________________________________________       Recent Labs: No results found for requested labs within last 365 days.   Lipid Panel    Component Value Date/Time   CHOL 139 05/30/2021 1000   TRIG 83.0 05/30/2021 1000   HDL 50.10 05/30/2021 1000   CHOLHDL 3 05/30/2021 1000   VLDL 16.6 05/30/2021 1000   LDLCALC 73 05/30/2021 1000   LDLCALC 141 (H) 05/31/2020 1050   LDLDIRECT 184.2 10/18/2010 0938     Additional studies/ records that were reviewed today include:  2D echo, coronary calcium  score, nuclear stress test    ASSESSMENT:    1. Elevated coronary artery calcium  score   2. Mixed hyperlipidemia   3. Essential hypertension   4. Ascending aorta dilatation (HCC)      PLAN:  In order of problems listed above:  #Elevated coronary calcium  score - The study was done 09/29/2022 showing a coronary calcium  score 1313 - He underwent stress Myoview  showing no ischemia - 2D echo showed low normal LV function EF 50 to 55% - He has not had any cardiac symptoms - continue medical management - Continue Crestor  20 mg daily - Add aspirin 81 mg daily  #Hyperlipidemia - LDL goal less than 70 I have personally reviewed and interpreted outside labs performed by patient's PCP which showed LDL 111, HDL 53, ALT 20 on 12/04/2023 - Recommend increasing Crestor  to 40 mg daily - Repeat FLP and ALT in 6 weeks  #Hypertension - BP controlled on exam today - Currently diet controlled  #Ascending aorta aneurysm - Recent echo showed an ascending aortic dilatation measuring 46 mm and CT of the chest a year ago measured 49 mm -  Will get a chest CTA for further assessment  Time Spent: 20 minutes total time of encounter, including 15 minutes spent in face-to-face patient care on the date of this encounter. This time includes coordination of care and counseling regarding above mentioned problem list. Remainder of non-face-to-face time involved reviewing chart documents/testing relevant to the patient encounter and documentation in the medical  record. I have independently reviewed documentation from referring provider  Followup:  1 year  Medication Adjustments/Labs and Tests Ordered: Current medicines are reviewed at length with the patient today.  Concerns regarding medicines are outlined above.  Medication changes, Labs and Tests ordered today are listed in the Patient Instructions below.  There are no Patient Instructions on file for this visit.   Signed, Gaylyn Keas, MD  12/18/2023 9:33 AM    [facility] [address] [place], [place], [place]  [address] Phone: [phone]; Fax: [phone]

## 2023-12-18 NOTE — Addendum Note (Signed)
 Addended by: Paarth Cropper L on: 12/18/2023 09:56 AM   Modules accepted: Orders

## 2023-12-18 NOTE — Addendum Note (Signed)
 Addended by: Misha Vanoverbeke L on: 12/18/2023 09:49 AM   Modules accepted: Orders

## 2023-12-21 DIAGNOSIS — H04123 Dry eye syndrome of bilateral lacrimal glands: Secondary | ICD-10-CM | POA: Diagnosis not present

## 2024-01-21 DIAGNOSIS — H04123 Dry eye syndrome of bilateral lacrimal glands: Secondary | ICD-10-CM | POA: Diagnosis not present

## 2024-01-28 DIAGNOSIS — Z79899 Other long term (current) drug therapy: Secondary | ICD-10-CM | POA: Diagnosis not present

## 2024-01-28 DIAGNOSIS — R931 Abnormal findings on diagnostic imaging of heart and coronary circulation: Secondary | ICD-10-CM | POA: Diagnosis not present

## 2024-01-28 DIAGNOSIS — E782 Mixed hyperlipidemia: Secondary | ICD-10-CM | POA: Diagnosis not present

## 2024-01-28 LAB — LIPID PANEL
Chol/HDL Ratio: 3 ratio (ref 0.0–5.0)
Cholesterol, Total: 153 mg/dL (ref 100–199)
HDL: 51 mg/dL (ref >39)
LDL Chol Calc (NIH): 90 mg/dL (ref 0–99)
Triglycerides: 60 mg/dL (ref 0–149)
VLDL Cholesterol Cal: 12 mg/dL (ref 5–40)

## 2024-01-28 LAB — ALT: ALT: 21 IU/L (ref 0–44)

## 2024-01-29 ENCOUNTER — Ambulatory Visit: Payer: Self-pay | Admitting: Cardiology

## 2024-01-29 DIAGNOSIS — I7121 Aneurysm of the ascending aorta, without rupture: Secondary | ICD-10-CM

## 2024-02-06 ENCOUNTER — Telehealth: Payer: Self-pay

## 2024-02-06 DIAGNOSIS — E785 Hyperlipidemia, unspecified: Secondary | ICD-10-CM

## 2024-02-06 DIAGNOSIS — Z79899 Other long term (current) drug therapy: Secondary | ICD-10-CM

## 2024-02-06 MED ORDER — EZETIMIBE 10 MG PO TABS: 10.0000 mg | ORAL_TABLET | Freq: Every day | ORAL | 3 refills | Status: AC

## 2024-02-06 NOTE — Telephone Encounter (Signed)
 MC to patient to advise Dr. Shlomo recommends to add Zetia  10mg  daily and repeat FLP and ALT In 6 weeks. Orders placed.

## 2024-02-18 ENCOUNTER — Ambulatory Visit (HOSPITAL_COMMUNITY)
Admission: RE | Admit: 2024-02-18 | Discharge: 2024-02-18 | Disposition: A | Discharge status: Home / Self Care | Source: Ambulatory Visit | Attending: Cardiology | Admitting: Cardiology

## 2024-02-18 DIAGNOSIS — I77810 Thoracic aortic ectasia: Secondary | ICD-10-CM | POA: Insufficient documentation

## 2024-02-18 DIAGNOSIS — I7121 Aneurysm of the ascending aorta, without rupture: Secondary | ICD-10-CM | POA: Diagnosis not present

## 2024-02-18 DIAGNOSIS — I700 Atherosclerosis of aorta: Secondary | ICD-10-CM | POA: Diagnosis not present

## 2024-02-18 MED ORDER — IOHEXOL 350 MG/ML SOLN
75.0000 mL | Freq: Once | INTRAVENOUS | Status: AC | PRN
  Administered 2024-02-18: 75 mL via INTRAVENOUS

## 2024-02-19 ENCOUNTER — Encounter: Payer: Self-pay | Admitting: Cardiology

## 2024-02-20 ENCOUNTER — Encounter: Payer: Self-pay | Admitting: Cardiology

## 2024-02-20 DIAGNOSIS — I7121 Aneurysm of the ascending aorta, without rupture: Secondary | ICD-10-CM | POA: Insufficient documentation

## 2024-02-27 NOTE — Telephone Encounter (Signed)
-----   Message from Wilbert Bihari sent at 02/20/2024  8:29 PM EDT ----- Chest CT showed 4.7 cm ascending aortic aneurysm.  Please refer to Dr. Lucas for further evaluation ----- Message ----- From: Interface, Rad Results In Sent: 02/20/2024   4:56 PM EDT To: Wilbert JONELLE Bihari, MD

## 2024-02-27 NOTE — Telephone Encounter (Signed)
 Call to patient to discuss Chest CT showed 4.7 cm ascending aortic aneurysm. Patient verbalizes understanding and agrees to referral to Dr. Lucas for further evaluation.

## 2024-03-11 NOTE — Progress Notes (Unsigned)
 [address] [place] [address]             [phone]            BOLTON CANUPP [id number] 05/27/1946   History of Present Illness: Mr. Hunter Collins is a 78 year old male with medical history of hypertension, atherosclerotic heart disease of native coronary arteries without chest pain, hyperlipidemia, osteoarthritis and chronic rhinitis who presents for initial encounter of ascending thoracic aortic aneurysm.  Aneurysm was found on cardiac scoring CT scan in 2024 and measured 4.9 cm.  On CTA of chest on 02/18/2024 aneurysm measured 4.7 cm.  Echocardiogram showed tricuspid aortic valve with mild aortic regurgitation. Blood pressure Exercise symptoms     Current Outpatient Medications on File Prior to Visit  Medication Sig Dispense Refill   diphenhydrAMINE (BENADRYL) 25 mg capsule Take 25 mg by mouth every 6 (six) hours as needed.     ezetimibe  (ZETIA ) 10 MG tablet Take 1 tablet (10 mg total) by mouth daily. 90 tablet 3   famotidine (PEPCID) 40 MG tablet Take 40 mg by mouth daily.     fluticasone  (FLONASE ) 50 MCG/ACT nasal spray SHAKE LIQUID AND USE 1 SPRAY IN EACH NOSTRIL TWICE DAILY AS NEEDED FOR NASAL CONGESTION 16 g 5   glucosamine-chondroitin 500-400 MG tablet Take 1 tablet by mouth daily.     ibuprofen (ADVIL,MOTRIN) 200 MG tablet Take 800 mg by mouth every 6 (six) hours as needed for moderate pain.     latanoprost (XALATAN) 0.005 % ophthalmic solution 1 drop at bedtime.     Multiple Vitamin (MULTIVITAMIN WITH MINERALS) TABS tablet Take 1 tablet by mouth daily.     Omega-3 Fatty Acids (FISH OIL) 1000 MG CAPS Take by mouth. Take One Capsule Daily.     rosuvastatin  (CRESTOR ) 40 MG tablet Take 1 tablet (40 mg total) by mouth daily. 90 tablet 3   No current facility-administered medications on file prior to visit.     ROS: ROS   There were no vitals taken for this visit.  Physical Exam   Imaging: CLINICAL DATA:  Aortic aneurysm.    EXAM: CT ANGIOGRAPHY CHEST WITH CONTRAST   TECHNIQUE: Multidetector CT imaging of the chest was performed using the standard protocol during bolus administration of intravenous contrast. Multiplanar CT image reconstructions and MIPs were obtained to evaluate the vascular anatomy.   RADIATION DOSE REDUCTION: This exam was performed according to the departmental dose-optimization program which includes automated exposure control, adjustment of the mA and/or kV according to patient size and/or use of iterative reconstruction technique.   CONTRAST:  75mL OMNIPAQUE  IOHEXOL  350 MG/ML SOLN   COMPARISON:  CT dated 09/29/2022.   FINDINGS: Cardiovascular: There is no cardiomegaly or pericardial effusion. Three-vessel coronary vascular calcification. Aneurysmal dilatation of the ascending aorta measuring up to 4.7 cm. No aortic dissection. There is mild atherosclerotic calcification of the thoracic aorta. The origins of the great vessels of the aortic arch and the central pulmonary arteries are patent.   Mediastinum/Nodes: No hilar or mediastinal adenopathy. The esophagus is grossly unremarkable no mediastinal fluid collection.   Lungs/Pleura: No focal consolidation, pleural effusion, pneumothorax. The central airways are patent.   Upper Abdomen: No acute abnormality.   Musculoskeletal: No acute osseous pathology.   Review of the MIP images confirms the above findings.   IMPRESSION: 1. Aneurysmal dilatation of the ascending aorta measuring up to 4.7 cm. Ascending thoracic aortic aneurysm. Recommend semi-annual  imaging followup by CTA or MRA and referral to cardiothoracic surgery if not already obtained. This recommendation follows 2010 ACCF/AHA/AATS/ACR/ASA/SCA/SCAI/SIR/STS/SVM Guidelines for the Diagnosis and Management of Patients With Thoracic Aortic Disease. Circulation. 2010; 121: Z733-z630. Aortic aneurysm NOS (ICD10-I71.9) 2. No acute intrathoracic pathology. 3.  Aortic  Atherosclerosis (ICD10-I70.0).     Electronically Signed   By: Vanetta Chou M.D.   On: 02/20/2024 16:54     A/P: Aneurysm of ascending aorta without rupture (HCC) -4.7 cm - 4.9 cm ascending thoracic aortic aneurysm.  Echocardiogram showed tricuspid aortic valve.  We discussed the natural history and and risk factors for growth of ascending aortic aneurysms. Discussed recommendations to minimize the risk of further expansion or dissection including careful blood pressure control, avoidance of contact sports and heavy lifting, attention to lipid management.  We covered the importance of smoking ***cessation/staying never user.  The patient does not yet meet surgical criteria of >5.5cm. The patient is aware of signs and symptoms of aortic dissection and when to present to the emergency department   -Follow up in 6 months with CTA of chest for continued surveillance   Risk Modification:  Statin:  rosuvastatin   Smoking cessation instruction/counseling given:  {CHL AMB [facility] SMOKING CESSATION COUNSELING:20758}  Patient was counseled on importance of Blood Pressure Control  They are instructed to contact their Primary Care Physician if they start to have blood pressure readings over 130s/90s. Do not ever stop blood pressure medications on your own, unless instructed by healthcare professional.  Please avoid use of Fluoroquinolones as this can potentially increase your risk of Aortic Rupture and/or Dissection  Patient educated on signs and symptoms of Aortic Dissection, handout also provided in AVS  Manuelita CHRISTELLA Rough, PA-C 03/11/24

## 2024-03-11 NOTE — Patient Instructions (Signed)

## 2024-03-12 ENCOUNTER — Ambulatory Visit

## 2024-03-12 VITALS — BP 124/73 | HR 54 | Resp 20 | Ht 68.0 in | Wt 185.0 lb

## 2024-03-12 DIAGNOSIS — I7121 Aneurysm of the ascending aorta, without rupture: Secondary | ICD-10-CM | POA: Diagnosis not present

## 2024-03-20 ENCOUNTER — Encounter: Payer: Self-pay | Admitting: Cardiology

## 2024-03-25 DIAGNOSIS — Z79899 Other long term (current) drug therapy: Secondary | ICD-10-CM | POA: Diagnosis not present

## 2024-03-25 DIAGNOSIS — E785 Hyperlipidemia, unspecified: Secondary | ICD-10-CM | POA: Diagnosis not present

## 2024-03-25 LAB — LIPID PANEL
Chol/HDL Ratio: 2.4 ratio (ref 0.0–5.0)
Cholesterol, Total: 116 mg/dL (ref 100–199)
HDL: 49 mg/dL (ref >39)
LDL Chol Calc (NIH): 53 mg/dL (ref 0–99)
Triglycerides: 64 mg/dL (ref 0–149)
VLDL Cholesterol Cal: 14 mg/dL (ref 5–40)

## 2024-03-26 ENCOUNTER — Ambulatory Visit: Payer: Self-pay | Admitting: Cardiology

## 2024-04-01 ENCOUNTER — Other Ambulatory Visit: Payer: Self-pay | Admitting: Allergy

## 2024-04-02 ENCOUNTER — Encounter: Payer: Self-pay | Admitting: Cardiology

## 2024-04-03 MED ORDER — ROSUVASTATIN CALCIUM 20 MG PO TABS: 20.0000 mg | ORAL_TABLET | Freq: Every day | ORAL | 3 refills | Status: DC

## 2024-04-14 ENCOUNTER — Other Ambulatory Visit: Payer: Self-pay

## 2024-04-14 DIAGNOSIS — E785 Hyperlipidemia, unspecified: Secondary | ICD-10-CM

## 2024-04-27 ENCOUNTER — Other Ambulatory Visit: Payer: Self-pay | Admitting: Allergy

## 2024-05-05 ENCOUNTER — Ambulatory Visit: Attending: Cardiology

## 2024-05-05 DIAGNOSIS — E785 Hyperlipidemia, unspecified: Secondary | ICD-10-CM | POA: Diagnosis not present

## 2024-05-05 MED ORDER — ROSUVASTATIN CALCIUM 10 MG PO TABS: 10.0000 mg | ORAL_TABLET | Freq: Every day | ORAL | 2 refills | Status: DC

## 2024-05-05 NOTE — Assessment & Plan Note (Addendum)
 Assessment:  LDL goal: < 70 mg/dl; last LDLc 53 mg/dl (0/7974) on rosuvastatin  20 mg and ezetimibe  10 mg daily Patient was experiencing headache on rosuvastatin  40 mg and even on lower dose of 20 mg; Patient is tolerating ezetimibe  well without any side effects Patient willing to trial a decreased dose of rosuvastatin  10 mg daily Discussed the cardiovascular benefits of statin therapy with the patient, including the potential to further reduce the dose or frequency (every other day) if headaches persist Discussed with patient that new dose is of moderate intensity and lowers LDL on average ~ 30% Patient understanding maintaining a healthy diet and regular exercise will be important to help offset the potential impact of reduced statin dose.    Plan: Stop rosuvastatin  20 mg daily Begin rosuvastatin  10 mg daily  Continue ezetimibe  10 mg daily  Repeat fasting lipid lab due in 3 months  Patient to contact office if headaches still persist

## 2024-05-05 NOTE — Progress Notes (Signed)
 Patient ID: Hunter Collins                 DOB: 12-21-1945                    MRN: [id number]      HPI: Hunter Collins is a 78 y.o. male patient referred to lipid clinic by Dr. Shlomo. PMH is significant for HLD, elevated coronary calcium  score (1313 on 09/2022), and ascending aorta aneurysm.  Patient presents today for PharmD lipid clinic in good spirits. Patient's rosuvastatin  was increased from 20 to 40 mg daily ~ 1 month ago and he began to experience headaches. He has since went back down to rosuvastatin  20 mg daily and been taking consistently for a few weeks but he is still experiencing headaches. He shares that he's been taking rosuvastatin  for years now and was initially started on the 10 mg dose. He reports tolerating the 10 mg well but experienced headache when he increased to the 20 mg dose. He understands that this may be his body getting used to the medication but it has been bothersome. He also takes ezetimibe  10 mg daily for HLD management and has been for some time; He is tolerating this ezetimibe  well without any side effects.  The patient reports a history of headaches and suspects that the recent increase in rosuvastatin  dosage may have exacerbated them. He is considering discussing headache management with his primary care provider.   Current Medications: rosuvastatin  20 mg daily and ezetimibe  10 mg daily  Intolerances: rosuvastatin  20 mg and 40 mg (headaches) Risk Factors: HTN, elevated coronary calcium  score (1313 on 09/2022) LDL goal: < 70 Lipid panel (03/2024): Chol 116, Trig 64, HDL 49, LDL 53 (reflective of rosuvastatin  20 mg daily and ezetimibe  10 mg daily)  ALT (01/2024): 21   Diet:  Breakfast: eggs Lunch: doesn't eat lunch Dinner: baked protein, vegetables Drinks: 1 energy drink per day, 2-3 cups of coffee per day  Exercise:  Patient is active. 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  Family History:   Relation Problem Comments  Mother (Deceased)   Father (Deceased)   Brother (Deceased)   Neg Hx Colon cancer       Social History:  Alcohol:none  Smoking: none   Labs:  Lipid Panel     Component Value Date/Time   CHOL 116 03/25/2024 0851   TRIG 64 03/25/2024 0851   HDL 49 03/25/2024 0851   CHOLHDL 2.4 03/25/2024 0851   CHOLHDL 3 05/30/2021 1000   VLDL 16.6 05/30/2021 1000   LDLCALC 53 03/25/2024 0851   LDLCALC 141 (H) 05/31/2020 1050   LDLDIRECT 184.2 10/18/2010 0938   LABVLDL 14 03/25/2024 0851    Past Medical History:  Diagnosis Date   Allergy     Ascending aortic aneurysm    4.7cm by CTA 01/2024   Bradycardia     MY HEART RATE IS USUALLY AROUD 50 - I EXERCISE A LOT   Coronary artery disease    Inguinal hernia    RIGHT    Current Outpatient Medications on File Prior to Visit  Medication Sig Dispense Refill   diphenhydrAMINE (BENADRYL) 25 mg capsule Take 25 mg by mouth every 6 (six) hours as needed.     ezetimibe  (ZETIA ) 10 MG tablet Take 1 tablet (10 mg total) by mouth daily. 90 tablet 3   famotidine (PEPCID) 40 MG tablet Take 40  mg by mouth daily.     fluticasone  (FLONASE ) 50 MCG/ACT nasal spray SHAKE LIQUID AND USE 1 SPRAY IN EACH NOSTRIL TWICE DAILY AS NEEDED FOR NASAL CONGESTION 16 g 5   glucosamine-chondroitin 500-400 MG tablet Take 1 tablet by mouth daily.     ibuprofen (ADVIL,MOTRIN) 200 MG tablet Take 800 mg by mouth every 6 (six) hours as needed for moderate pain.     latanoprost (XALATAN) 0.005 % ophthalmic solution 1 drop at bedtime.     Multiple Vitamin (MULTIVITAMIN WITH MINERALS) TABS tablet Take 1 tablet by mouth daily.     Omega-3 Fatty Acids (FISH OIL) 1000 MG CAPS Take by mouth. Take One Capsule Daily.     No current facility-administered medications on file prior to visit.    Allergies  Allergen Reactions   Quinolones Other (See Comments)    Ascending thoracic aortic aneurysm, use with caution    Assessment/Plan:  1. Hyperlipidemia  -  Problem  Dyslipidemia   Dyslipidemia Assessment:  LDL goal: < 70 mg/dl; last LDLc 53 mg/dl (0/7974) on rosuvastatin  20 mg and ezetimibe  10 mg daily Patient was experiencing headache on rosuvastatin  40 mg and even on lower dose of 20 mg; Patient is tolerating ezetimibe  well without any side effects Patient willing to trial a decreased dose of rosuvastatin  10 mg daily Discussed the cardiovascular benefits of statin therapy with the patient, including the potential to further reduce the dose or frequency (every other day) if headaches persist Discussed with patient that new dose is of moderate intensity and lowers LDL on average ~ 30% Patient understanding maintaining a healthy diet and regular exercise will be important to help offset the potential impact of reduced statin dose.    Plan: Stop rosuvastatin  20 mg daily Begin rosuvastatin  10 mg daily  Continue ezetimibe  10 mg daily  Repeat fasting lipid lab due in 3 months  Patient to contact office if headaches still persist   Thank you,  Shahiem Bedwell E. Moya Duan, Pharm.D [place] [name]. [facility] [address], [place], [place] [address] Phone: [phone]; Fax: [phone]

## 2024-05-05 NOTE — Patient Instructions (Addendum)
 Your Results:             Your most recent labs Goal  Total Cholesterol 116 < 200  Triglycerides 64 < 150  HDL (happy/good cholesterol) 49 > 40  LDL (lousy/bad cholesterol 53 < 70   Medication changes: Begin rosuvastatin  10 mg daily  Continue ezetimibe  10 mg daily  Recheck fasting lipid panel in 3 months (mid January 2026)  It is also recommended that patients with high cholesterol adhere to a heart healthy diet, get regular exercise, avoid use of tobacco products, and maintain a healthy weight. Steps that you can take to help in these areas:  Limit consumption of trans fats, saturated fats, and cholesterol in your diet  Increase intake of lean meats such as chicken, [place], and fish  Increase intake of foods rich in fiber such as fresh fruits, vegetables, beans and oatmeal Exercise as you are able; even 30 minutes of walking daily can aid in increasing heart health   Senora Lacson E. Cariana Karge, Pharm.D [place] [name]. [facility] [address], [place], [place] [address] Phone: [phone]; Fax: [phone]

## 2024-05-08 ENCOUNTER — Encounter: Payer: Self-pay | Admitting: Cardiology

## 2024-05-20 ENCOUNTER — Other Ambulatory Visit: Payer: Self-pay | Admitting: Medical Genetics

## 2024-05-20 DIAGNOSIS — Z006 Encounter for examination for normal comparison and control in clinical research program: Secondary | ICD-10-CM

## 2024-05-22 ENCOUNTER — Other Ambulatory Visit: Payer: Self-pay | Admitting: Ophthalmology

## 2024-05-22 DIAGNOSIS — H532 Diplopia: Secondary | ICD-10-CM | POA: Diagnosis not present

## 2024-05-28 ENCOUNTER — Encounter: Payer: Self-pay | Admitting: Ophthalmology

## 2024-05-29 ENCOUNTER — Ambulatory Visit: Payer: Self-pay | Admitting: Cardiology

## 2024-05-29 LAB — LIPID PANEL
Chol/HDL Ratio: 2.8 ratio (ref 0.0–5.0)
Cholesterol, Total: 132 mg/dL (ref 100–199)
HDL: 48 mg/dL (ref >39)
LDL Chol Calc (NIH): 64 mg/dL (ref 0–99)
Triglycerides: 107 mg/dL (ref 0–149)
VLDL Cholesterol Cal: 20 mg/dL (ref 5–40)

## 2024-06-02 ENCOUNTER — Ambulatory Visit
Admission: RE | Admit: 2024-06-02 | Discharge: 2024-06-02 | Disposition: A | Source: Ambulatory Visit | Attending: Ophthalmology | Admitting: Ophthalmology

## 2024-06-02 ENCOUNTER — Other Ambulatory Visit

## 2024-06-02 ENCOUNTER — Encounter: Payer: Self-pay | Admitting: Cardiology

## 2024-06-02 DIAGNOSIS — L814 Other melanin hyperpigmentation: Secondary | ICD-10-CM | POA: Diagnosis not present

## 2024-06-02 DIAGNOSIS — D225 Melanocytic nevi of trunk: Secondary | ICD-10-CM | POA: Diagnosis not present

## 2024-06-02 DIAGNOSIS — L570 Actinic keratosis: Secondary | ICD-10-CM | POA: Diagnosis not present

## 2024-06-02 DIAGNOSIS — H532 Diplopia: Secondary | ICD-10-CM

## 2024-06-02 DIAGNOSIS — L821 Other seborrheic keratosis: Secondary | ICD-10-CM | POA: Diagnosis not present

## 2024-06-02 MED ORDER — ROSUVASTATIN CALCIUM 10 MG PO TABS: 10.0000 mg | ORAL_TABLET | Freq: Every day | ORAL | 1 refills | Status: AC

## 2024-06-11 DIAGNOSIS — H532 Diplopia: Secondary | ICD-10-CM | POA: Diagnosis not present

## 2024-06-20 DIAGNOSIS — J329 Chronic sinusitis, unspecified: Secondary | ICD-10-CM | POA: Diagnosis not present

## 2024-08-01 ENCOUNTER — Other Ambulatory Visit: Payer: Self-pay

## 2024-08-01 DIAGNOSIS — I7121 Aneurysm of the ascending aorta, without rupture: Secondary | ICD-10-CM

## 2024-08-04 ENCOUNTER — Encounter: Payer: Self-pay | Admitting: Cardiology

## 2024-08-04 ENCOUNTER — Other Ambulatory Visit: Payer: Self-pay | Admitting: Cardiology

## 2024-09-04 ENCOUNTER — Ambulatory Visit (HOSPITAL_COMMUNITY)

## 2024-09-10 ENCOUNTER — Ambulatory Visit
